# Patient Record
Sex: Female | Born: 1960 | Race: Black or African American | Hispanic: No | Marital: Single | Smoking: Former smoker
Health system: Southern US, Community
[De-identification: ages and names within clinical notes are randomized; demographics above are authoritative.]

## PROBLEM LIST (undated history)

## (undated) DIAGNOSIS — E119 Type 2 diabetes mellitus without complications: Secondary | ICD-10-CM

## (undated) DIAGNOSIS — I10 Essential (primary) hypertension: Secondary | ICD-10-CM

## (undated) DIAGNOSIS — Z8669 Personal history of other diseases of the nervous system and sense organs: Secondary | ICD-10-CM

## (undated) DIAGNOSIS — Z8619 Personal history of other infectious and parasitic diseases: Secondary | ICD-10-CM

## (undated) DIAGNOSIS — Z789 Other specified health status: Secondary | ICD-10-CM

## (undated) DIAGNOSIS — R519 Headache, unspecified: Secondary | ICD-10-CM

## (undated) DIAGNOSIS — J45909 Unspecified asthma, uncomplicated: Secondary | ICD-10-CM

## (undated) DIAGNOSIS — I82409 Acute embolism and thrombosis of unspecified deep veins of unspecified lower extremity: Secondary | ICD-10-CM

## (undated) DIAGNOSIS — E785 Hyperlipidemia, unspecified: Secondary | ICD-10-CM

## (undated) HISTORY — PX: FRACTURE SURGERY: SHX138

## (undated) HISTORY — PX: ORIF WRIST FRACTURE: SHX2133

---

## 2003-04-07 DIAGNOSIS — I82409 Acute embolism and thrombosis of unspecified deep veins of unspecified lower extremity: Secondary | ICD-10-CM

## 2003-04-07 HISTORY — PX: ABDOMINAL HYSTERECTOMY: SHX81

## 2003-04-07 HISTORY — DX: Acute embolism and thrombosis of unspecified deep veins of unspecified lower extremity: I82.409

## 2004-02-19 ENCOUNTER — Emergency Department: Payer: Self-pay | Admitting: General Practice

## 2004-02-19 ENCOUNTER — Other Ambulatory Visit: Payer: Self-pay

## 2005-03-25 ENCOUNTER — Emergency Department: Payer: Self-pay | Admitting: Emergency Medicine

## 2005-06-25 ENCOUNTER — Emergency Department: Payer: Self-pay | Admitting: Emergency Medicine

## 2005-11-28 ENCOUNTER — Inpatient Hospital Stay: Payer: Self-pay

## 2005-11-28 ENCOUNTER — Other Ambulatory Visit: Payer: Self-pay

## 2005-12-02 ENCOUNTER — Inpatient Hospital Stay: Payer: Self-pay | Admitting: Internal Medicine

## 2005-12-14 ENCOUNTER — Inpatient Hospital Stay: Payer: Self-pay | Admitting: Obstetrics and Gynecology

## 2005-12-19 ENCOUNTER — Inpatient Hospital Stay: Payer: Self-pay | Admitting: Internal Medicine

## 2006-01-21 ENCOUNTER — Emergency Department: Payer: Self-pay | Admitting: Emergency Medicine

## 2006-04-10 ENCOUNTER — Emergency Department: Payer: Self-pay | Admitting: Emergency Medicine

## 2006-04-27 ENCOUNTER — Ambulatory Visit: Payer: Self-pay | Admitting: Internal Medicine

## 2006-05-07 ENCOUNTER — Ambulatory Visit: Payer: Self-pay | Admitting: Internal Medicine

## 2006-06-03 ENCOUNTER — Ambulatory Visit: Payer: Self-pay | Admitting: Family Medicine

## 2006-06-05 ENCOUNTER — Ambulatory Visit: Payer: Self-pay | Admitting: Internal Medicine

## 2006-06-28 ENCOUNTER — Ambulatory Visit: Payer: Self-pay | Admitting: Vascular Surgery

## 2006-07-06 ENCOUNTER — Ambulatory Visit: Payer: Self-pay | Admitting: Internal Medicine

## 2006-07-21 ENCOUNTER — Emergency Department: Payer: Self-pay | Admitting: Emergency Medicine

## 2006-08-05 ENCOUNTER — Ambulatory Visit: Payer: Self-pay | Admitting: Internal Medicine

## 2006-10-05 ENCOUNTER — Ambulatory Visit: Payer: Self-pay | Admitting: Internal Medicine

## 2006-10-12 ENCOUNTER — Ambulatory Visit: Payer: Self-pay | Admitting: Internal Medicine

## 2006-10-25 ENCOUNTER — Ambulatory Visit: Payer: Self-pay | Admitting: Family Medicine

## 2006-11-05 ENCOUNTER — Ambulatory Visit: Payer: Self-pay | Admitting: Internal Medicine

## 2006-12-05 ENCOUNTER — Emergency Department: Payer: Self-pay | Admitting: Emergency Medicine

## 2007-04-07 ENCOUNTER — Ambulatory Visit: Payer: Self-pay | Admitting: Internal Medicine

## 2007-04-13 IMAGING — CR DG ANKLE COMPLETE 3+V*L*
1 series · 5 of 5 positions shown · non-contrast
Comparison: none

REASON FOR EXAM: left ankle pain
COMMENTS:

[Series 1: view not recorded · 0.17mm/px · 5 of 5 slices shown]
[im 1/5]
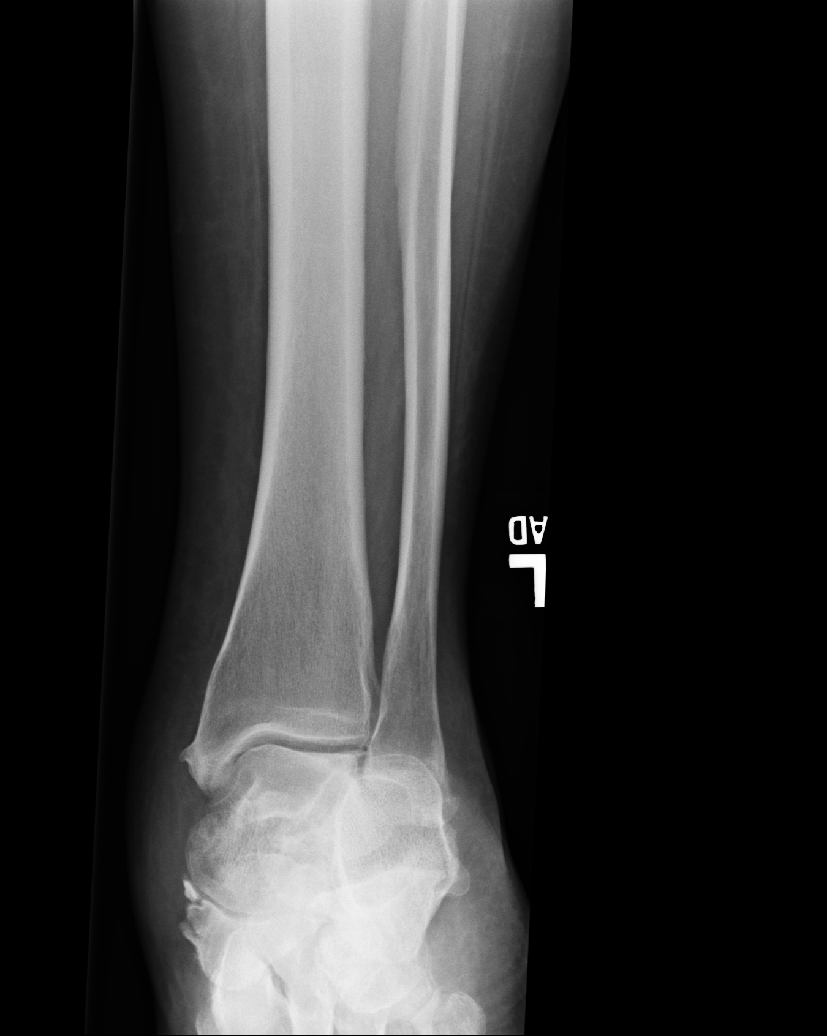
[im 2/5]
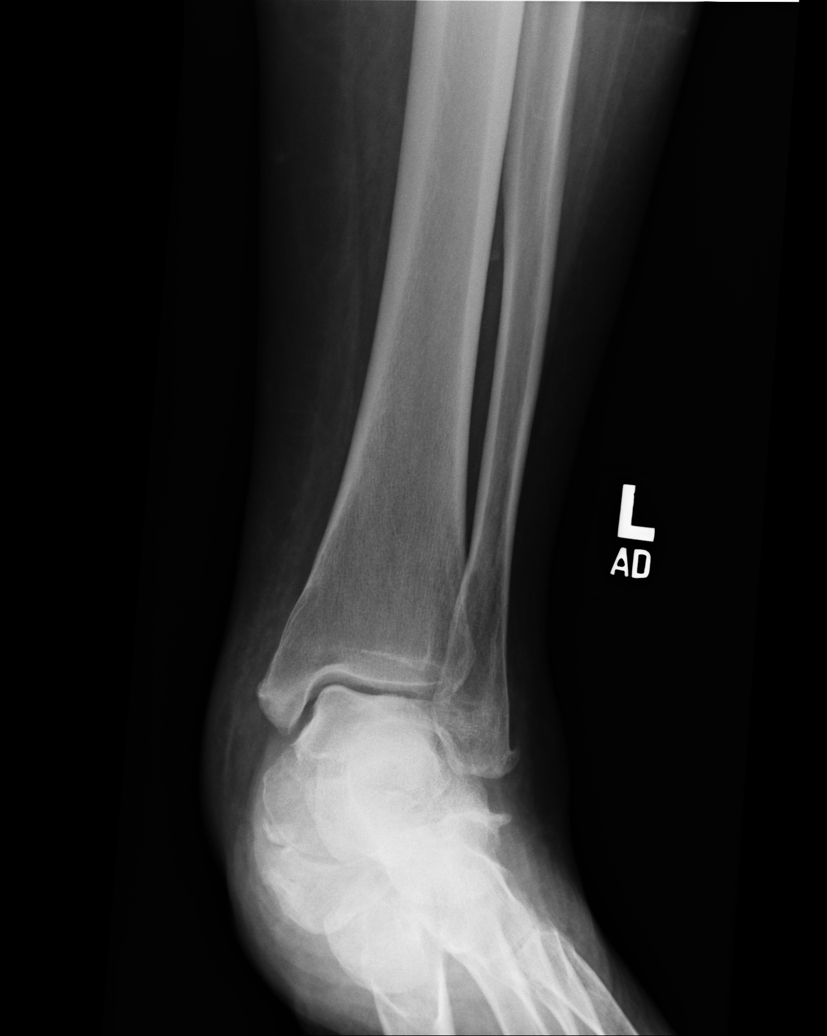
[im 3/5]
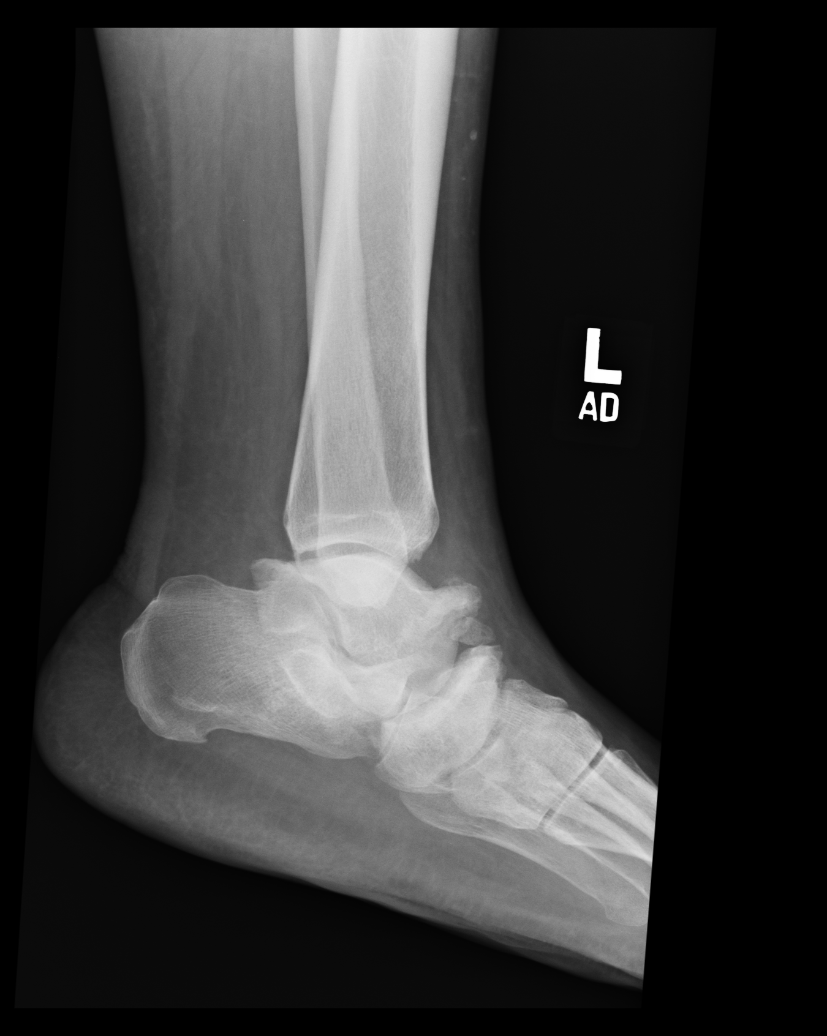
[im 4/5]
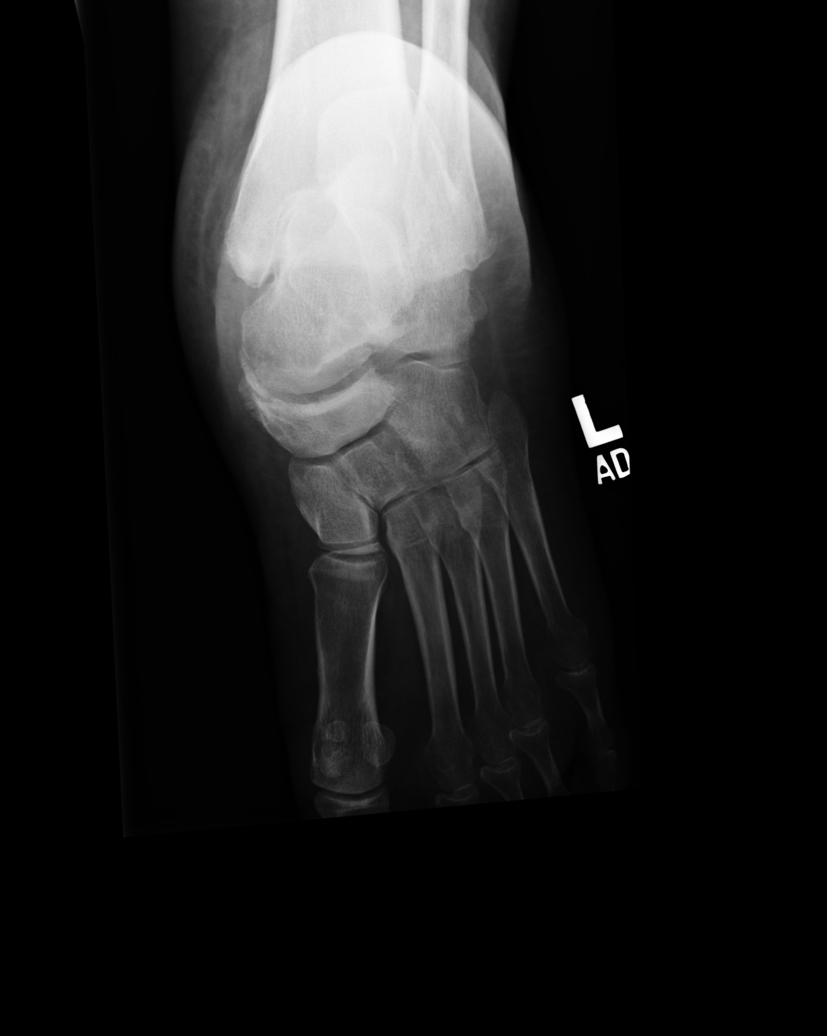
[im 5/5]
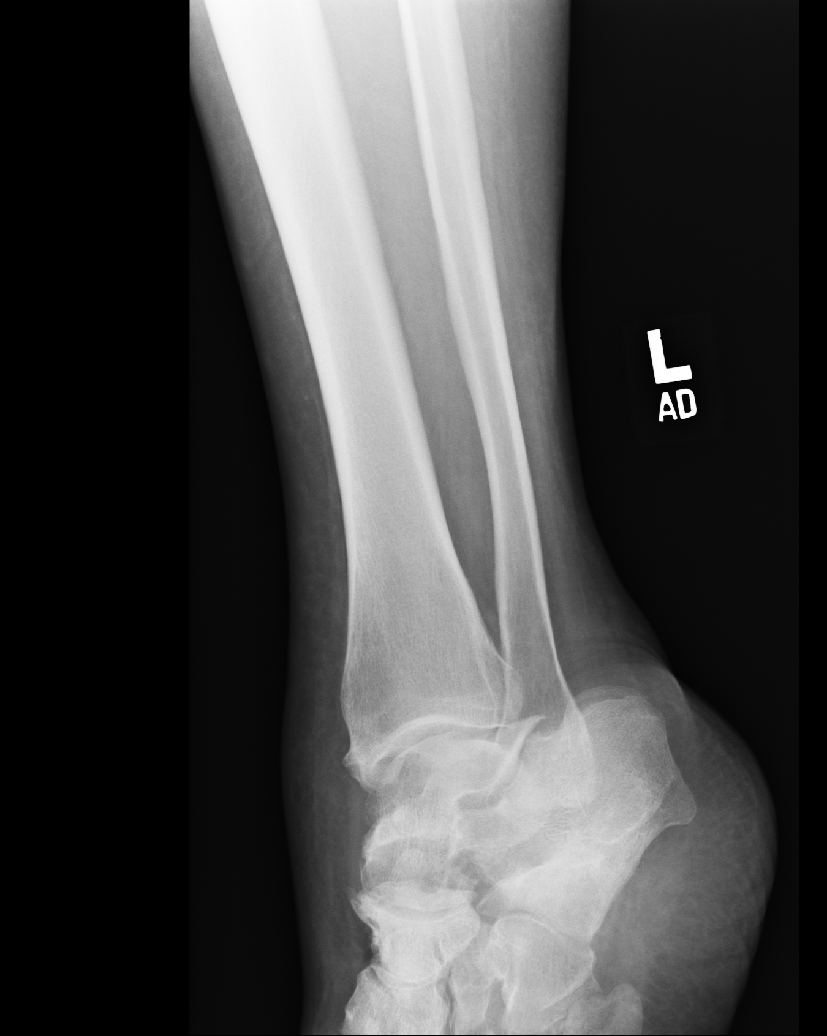

[5 of 5 positions shown; findings below may reference images not displayed]

PROCEDURE:     DXR - DXR ANKLE LEFT COMPLETE  - December 19, 2005 [DATE]

RESULT:          The patient is complaining of LEFT ankle discomfort.

There is soft tissue swelling along the medial and lateral malleoli.  The
ankle joint mortise is well maintained.  The talar dome appears intact.
There is a small spur off the inferolateral margin of the fibula.  A similar
spur is noted off the medial malleolus.
IMPRESSION: I do not see evidence of an acute fracture.  There is a
pes planus type contour of the foot.  If there are clinical findings that
might indicate abnormalities of the calcaneus or talus, further evaluation
with CT scanning may be indicated.  There is evidence of some degenerative
change involving the talonavicular joint with some bony fragmentation.  Is
there a history of trauma?  The patient has undergone prior ankle CT
scanning.  I do not have this report.

## 2007-04-14 ENCOUNTER — Ambulatory Visit: Payer: Self-pay | Admitting: Internal Medicine

## 2007-05-08 ENCOUNTER — Ambulatory Visit: Payer: Self-pay | Admitting: Internal Medicine

## 2007-07-11 ENCOUNTER — Ambulatory Visit: Payer: Self-pay | Admitting: Family Medicine

## 2007-10-05 ENCOUNTER — Ambulatory Visit: Payer: Self-pay | Admitting: Internal Medicine

## 2007-10-13 ENCOUNTER — Ambulatory Visit: Payer: Self-pay | Admitting: Internal Medicine

## 2007-10-15 IMAGING — US US EXTREM LOW VENOUS*L*
1 series · 17 of 24 positions shown · non-contrast
Comparison: none

REASON FOR EXAM: DVT Follow Up Call Report [id number]
COMMENTS:

[Series 1: us extrem low venous*left* · 17 of 28 slices shown]
[im 1/28]
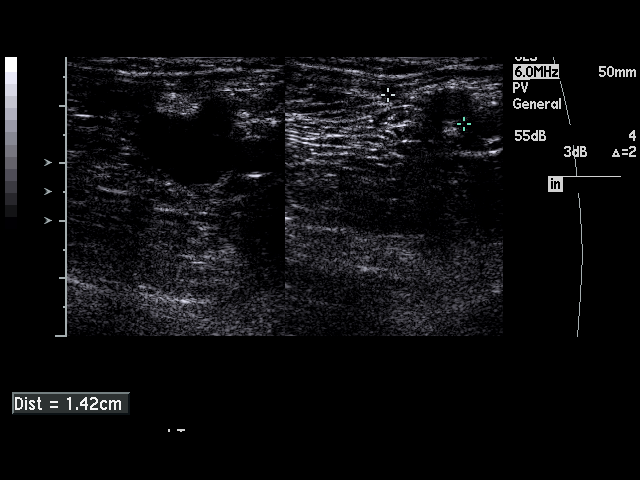
[im 3/28]
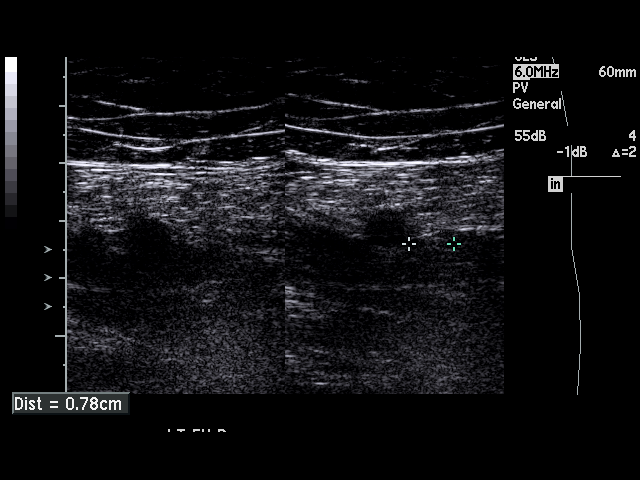
[im 4/28]
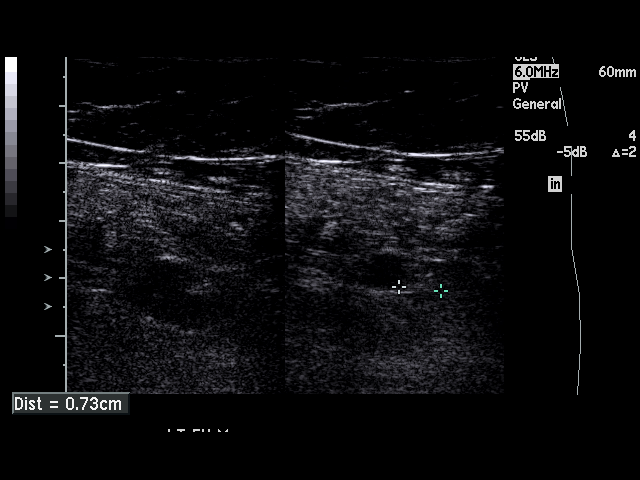
[im 5/28]
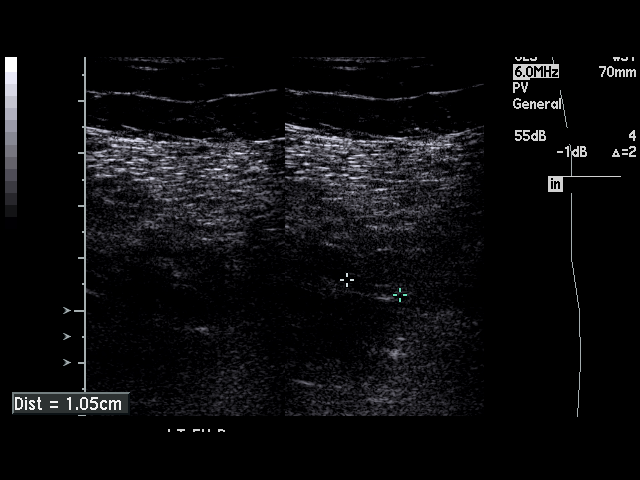
[im 8/28]
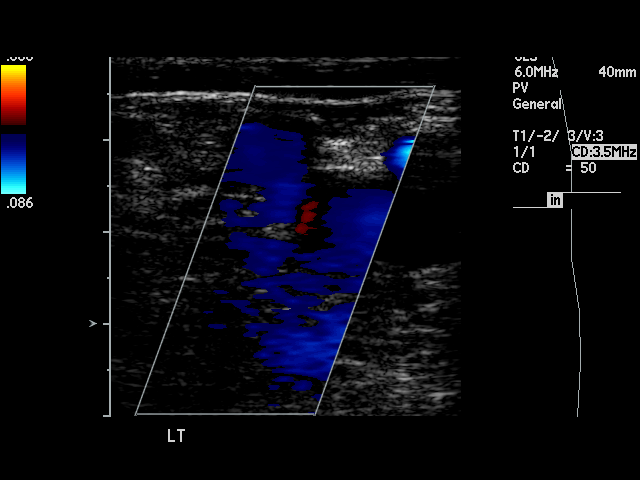
[im 9/28]
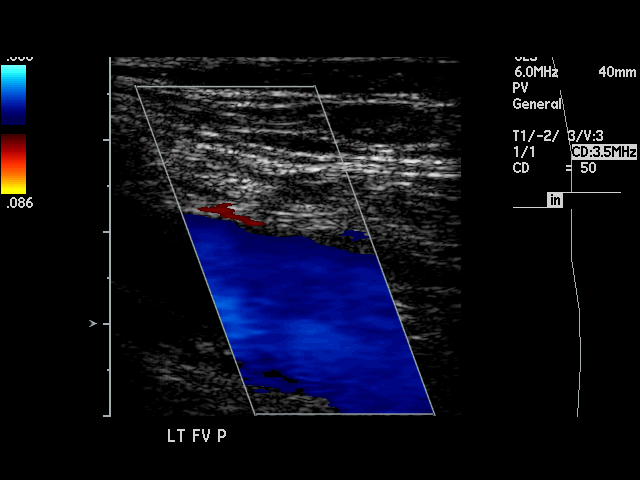
[im 11/28]
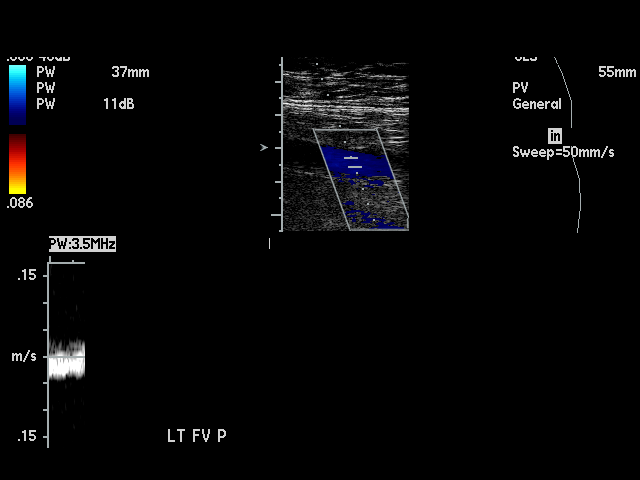
[im 12/28]
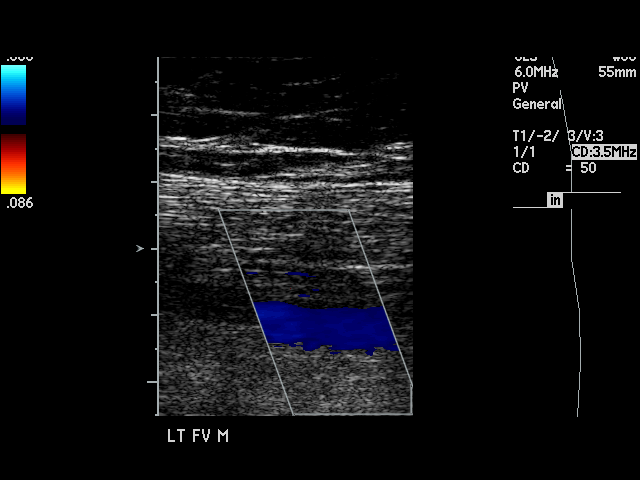
[im 15/28]
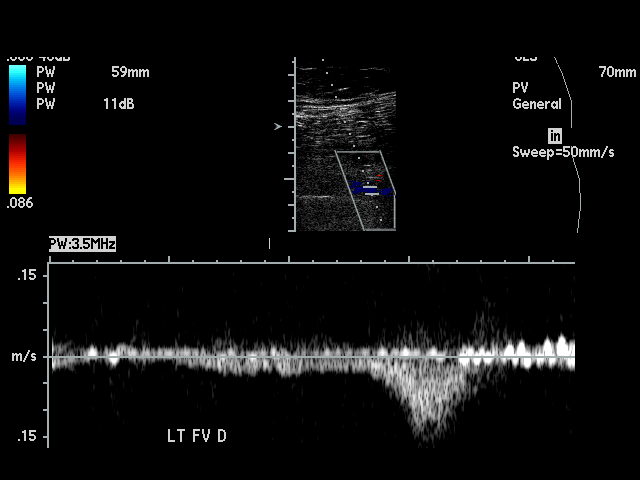
[im 16/28]
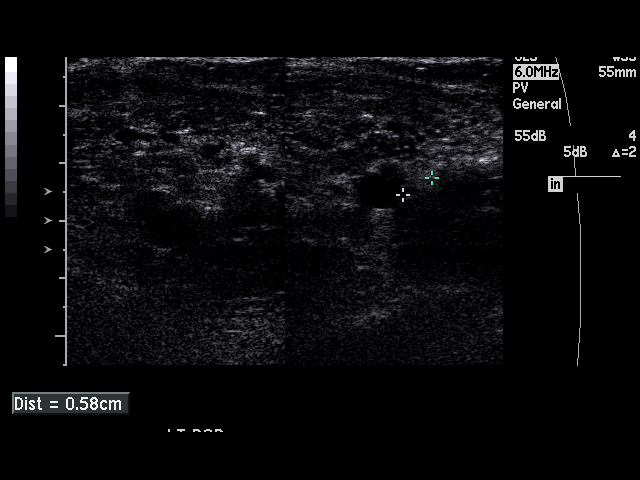
[im 17/28]
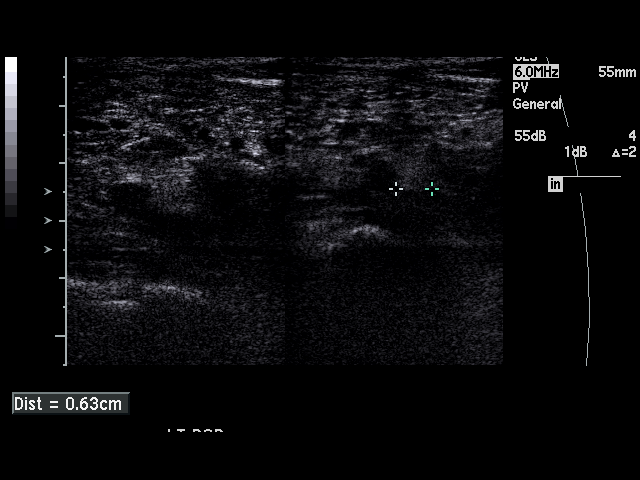
[im 19/28]
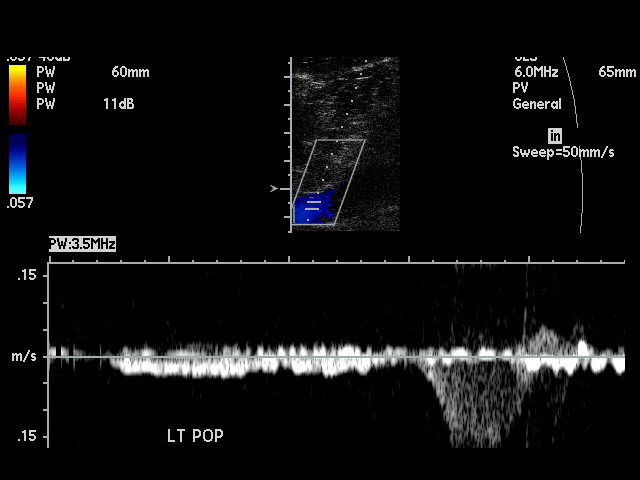
[im 20/28]
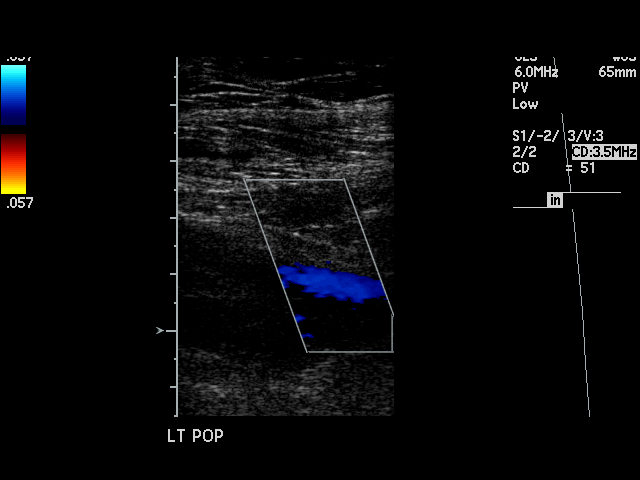
[im 23/28]
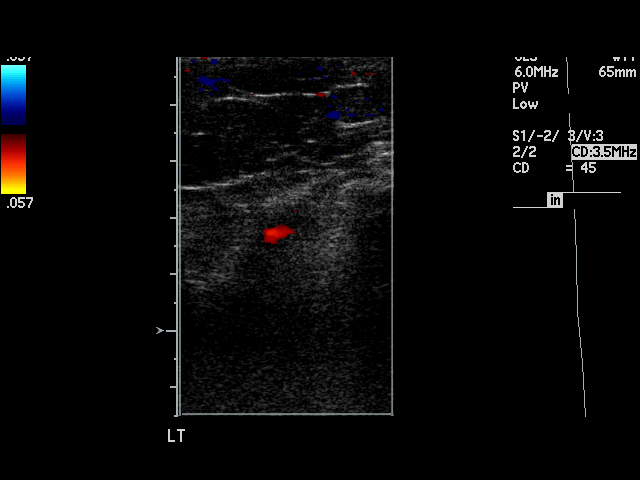
[im 24/28]
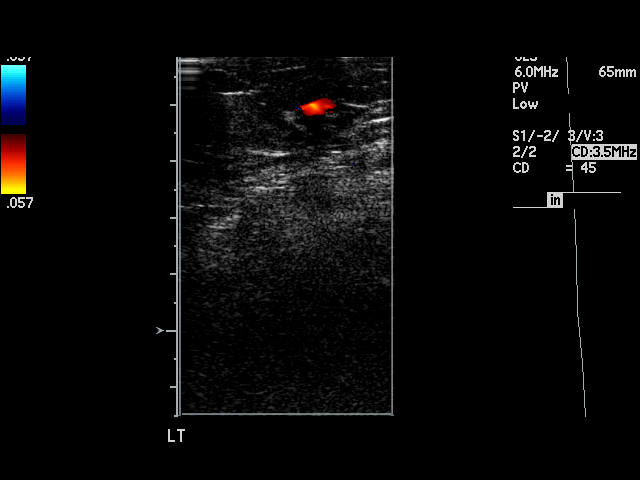
[im 25/28]
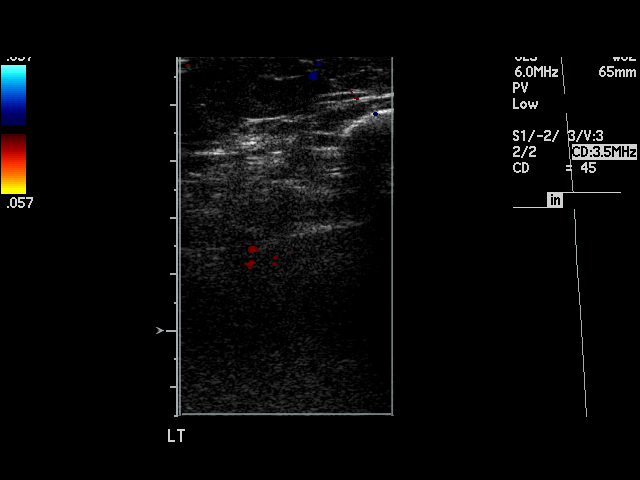
[im 28/28]
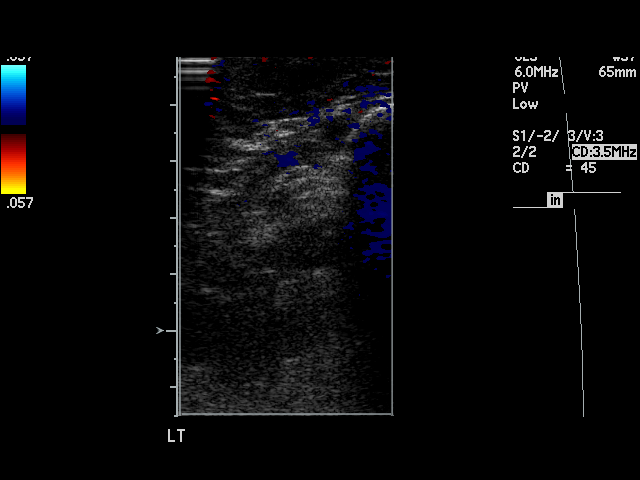

[17 of 24 positions shown; findings below may reference images not displayed]

PROCEDURE:     US  - US DOPPLER LOW EXTR LEFT  - June 22, 2006  [DATE]

RESULT:     The LEFT femoral and popliteal vein shows normal
compressibility. The phasic and augmentation Valsalva flow waveforms are
normal. Doppler examination shows no deep venous thrombosis. The
non-occlusive thrombus in the LEFT popliteal noted on the prior exam of
04/10/2006 is no longer seen
IMPRESSION: 1.     Normal study. No deep venous thrombosis is identified.

## 2007-11-01 ENCOUNTER — Ambulatory Visit: Payer: Self-pay | Admitting: Family Medicine

## 2007-11-05 ENCOUNTER — Ambulatory Visit: Payer: Self-pay | Admitting: Internal Medicine

## 2008-01-19 ENCOUNTER — Ambulatory Visit: Payer: Self-pay | Admitting: Family Medicine

## 2008-02-19 ENCOUNTER — Emergency Department: Payer: Self-pay | Admitting: Emergency Medicine

## 2008-03-07 ENCOUNTER — Ambulatory Visit: Payer: Self-pay | Admitting: Podiatry

## 2008-03-13 ENCOUNTER — Ambulatory Visit: Payer: Self-pay | Admitting: Podiatry

## 2008-03-16 ENCOUNTER — Ambulatory Visit: Payer: Self-pay | Admitting: Podiatry

## 2008-04-06 HISTORY — PX: ORIF FOOT FRACTURE: SHX2123

## 2008-10-04 ENCOUNTER — Ambulatory Visit: Payer: Self-pay | Admitting: Internal Medicine

## 2008-11-01 ENCOUNTER — Ambulatory Visit: Payer: Self-pay | Admitting: Podiatry

## 2008-11-04 ENCOUNTER — Ambulatory Visit: Payer: Self-pay | Admitting: Internal Medicine

## 2008-11-09 ENCOUNTER — Ambulatory Visit: Payer: Self-pay | Admitting: Podiatry

## 2009-03-20 ENCOUNTER — Ambulatory Visit: Payer: Self-pay

## 2009-07-10 ENCOUNTER — Emergency Department: Payer: Self-pay | Admitting: Internal Medicine

## 2010-04-17 ENCOUNTER — Ambulatory Visit: Payer: Self-pay | Admitting: Specialist

## 2010-04-24 ENCOUNTER — Ambulatory Visit: Payer: Self-pay | Admitting: Specialist

## 2010-05-14 ENCOUNTER — Ambulatory Visit: Payer: Self-pay | Admitting: Family Medicine

## 2010-07-26 ENCOUNTER — Emergency Department: Payer: Self-pay | Admitting: Emergency Medicine

## 2011-12-25 ENCOUNTER — Emergency Department: Payer: Self-pay | Admitting: Emergency Medicine

## 2013-03-31 ENCOUNTER — Emergency Department: Payer: Self-pay | Admitting: Emergency Medicine

## 2013-04-06 HISTORY — PX: FOOT FUSION: SHX956

## 2013-10-24 ENCOUNTER — Other Ambulatory Visit: Payer: Self-pay | Admitting: Orthopedic Surgery

## 2013-10-24 DIAGNOSIS — M25571 Pain in right ankle and joints of right foot: Secondary | ICD-10-CM

## 2013-12-14 ENCOUNTER — Ambulatory Visit
Admission: RE | Admit: 2013-12-14 | Discharge: 2013-12-14 | Disposition: A | Discharge status: Home / Self Care | Payer: BC Managed Care – PPO | Source: Ambulatory Visit | Attending: Orthopedic Surgery | Admitting: Orthopedic Surgery

## 2013-12-14 ENCOUNTER — Other Ambulatory Visit: Payer: Self-pay

## 2013-12-14 DIAGNOSIS — M25571 Pain in right ankle and joints of right foot: Secondary | ICD-10-CM

## 2014-06-26 ENCOUNTER — Encounter (HOSPITAL_BASED_OUTPATIENT_CLINIC_OR_DEPARTMENT_OTHER): Payer: Self-pay | Admitting: *Deleted

## 2014-06-26 NOTE — Progress Notes (Signed)
No pcp-no meds, denies any cardiac or resp problems, denies sleep apnea

## 2014-06-26 NOTE — Progress Notes (Signed)
Pt lives near [place] not need to come in for any labs-bmi over 46-she has had several ortho surgeries, no post op breathing problems

## 2014-07-05 ENCOUNTER — Encounter (HOSPITAL_BASED_OUTPATIENT_CLINIC_OR_DEPARTMENT_OTHER): Payer: Self-pay

## 2014-07-05 ENCOUNTER — Ambulatory Visit (HOSPITAL_BASED_OUTPATIENT_CLINIC_OR_DEPARTMENT_OTHER)
Admission: RE | Admit: 2014-07-05 | Discharge: 2014-07-05 | Disposition: A | Discharge status: Home / Self Care | Payer: BLUE CROSS/BLUE SHIELD | Source: Ambulatory Visit | Attending: Orthopedic Surgery | Admitting: Orthopedic Surgery

## 2014-07-05 ENCOUNTER — Encounter (HOSPITAL_BASED_OUTPATIENT_CLINIC_OR_DEPARTMENT_OTHER)
Admission: RE | Disposition: A | Discharge status: Home / Self Care | Payer: Self-pay | Source: Ambulatory Visit | Attending: Orthopedic Surgery

## 2014-07-05 ENCOUNTER — Ambulatory Visit (HOSPITAL_BASED_OUTPATIENT_CLINIC_OR_DEPARTMENT_OTHER): Payer: BLUE CROSS/BLUE SHIELD | Admitting: Certified Registered"

## 2014-07-05 DIAGNOSIS — Z6841 Body Mass Index (BMI) 40.0 and over, adult: Secondary | ICD-10-CM | POA: Diagnosis not present

## 2014-07-05 DIAGNOSIS — Z86718 Personal history of other venous thrombosis and embolism: Secondary | ICD-10-CM | POA: Insufficient documentation

## 2014-07-05 DIAGNOSIS — T8484XA Pain due to internal orthopedic prosthetic devices, implants and grafts, initial encounter: Secondary | ICD-10-CM | POA: Diagnosis not present

## 2014-07-05 DIAGNOSIS — Z791 Long term (current) use of non-steroidal anti-inflammatories (NSAID): Secondary | ICD-10-CM | POA: Insufficient documentation

## 2014-07-05 DIAGNOSIS — Y838 Other surgical procedures as the cause of abnormal reaction of the patient, or of later complication, without mention of misadventure at the time of the procedure: Secondary | ICD-10-CM | POA: Insufficient documentation

## 2014-07-05 DIAGNOSIS — T8484XD Pain due to internal orthopedic prosthetic devices, implants and grafts, subsequent encounter: Secondary | ICD-10-CM

## 2014-07-05 DIAGNOSIS — Z87891 Personal history of nicotine dependence: Secondary | ICD-10-CM | POA: Insufficient documentation

## 2014-07-05 DIAGNOSIS — Y929 Unspecified place or not applicable: Secondary | ICD-10-CM | POA: Diagnosis not present

## 2014-07-05 DIAGNOSIS — Z981 Arthrodesis status: Secondary | ICD-10-CM | POA: Diagnosis not present

## 2014-07-05 DIAGNOSIS — M25571 Pain in right ankle and joints of right foot: Secondary | ICD-10-CM | POA: Diagnosis present

## 2014-07-05 HISTORY — DX: Acute embolism and thrombosis of unspecified deep veins of unspecified lower extremity: I82.409

## 2014-07-05 HISTORY — DX: Other specified health status: Z78.9

## 2014-07-05 HISTORY — PX: HARDWARE REMOVAL: SHX979

## 2014-07-05 SURGERY — REMOVAL, HARDWARE
Anesthesia: General | Site: Ankle | Laterality: Right

## 2014-07-05 MED ORDER — FENTANYL CITRATE 0.05 MG/ML IJ SOLN
INTRAMUSCULAR | Status: AC
  Filled 2014-07-05: qty 6

## 2014-07-05 MED ORDER — MIDAZOLAM HCL 2 MG/2ML IJ SOLN
INTRAMUSCULAR | Status: AC
  Filled 2014-07-05: qty 2

## 2014-07-05 MED ORDER — HYDROMORPHONE HCL 1 MG/ML IJ SOLN
0.2500 mg | INTRAMUSCULAR | Status: DC | PRN
  Administered 2014-07-05: 0.25 mg via INTRAVENOUS
  Administered 2014-07-05: 0.5 mg via INTRAVENOUS
  Administered 2014-07-05: 0.25 mg via INTRAVENOUS
  Administered 2014-07-05: 0.5 mg via INTRAVENOUS

## 2014-07-05 MED ORDER — ONDANSETRON HCL 4 MG/2ML IJ SOLN
INTRAMUSCULAR | Status: DC | PRN
  Administered 2014-07-05: 4 mg via INTRAVENOUS

## 2014-07-05 MED ORDER — DEXTROSE 5 % IV SOLN
3.0000 g | INTRAVENOUS | Status: AC
  Administered 2014-07-05: 3 g via INTRAVENOUS

## 2014-07-05 MED ORDER — OXYCODONE HCL 5 MG PO TABS
5.0000 mg | ORAL_TABLET | Freq: Once | ORAL | Status: AC | PRN
  Administered 2014-07-05: 5 mg via ORAL

## 2014-07-05 MED ORDER — DEXAMETHASONE SODIUM PHOSPHATE 10 MG/ML IJ SOLN
INTRAMUSCULAR | Status: DC | PRN
  Administered 2014-07-05: 10 mg via INTRAVENOUS

## 2014-07-05 MED ORDER — CEFAZOLIN SODIUM-DEXTROSE 2-3 GM-% IV SOLR
INTRAVENOUS | Status: AC
  Filled 2014-07-05: qty 50

## 2014-07-05 MED ORDER — 0.9 % SODIUM CHLORIDE (POUR BTL) OPTIME
TOPICAL | Status: DC | PRN
  Administered 2014-07-05: 200 mL

## 2014-07-05 MED ORDER — LACTATED RINGERS IV SOLN
INTRAVENOUS | Status: DC
  Administered 2014-07-05 (×2): via INTRAVENOUS

## 2014-07-05 MED ORDER — FENTANYL CITRATE 0.05 MG/ML IJ SOLN: 50.0000 ug | INTRAMUSCULAR | Status: DC | PRN

## 2014-07-05 MED ORDER — HYDROMORPHONE HCL 1 MG/ML IJ SOLN
INTRAMUSCULAR | Status: AC
  Filled 2014-07-05: qty 1

## 2014-07-05 MED ORDER — LIDOCAINE HCL (CARDIAC) 20 MG/ML IV SOLN
INTRAVENOUS | Status: DC | PRN
  Administered 2014-07-05: 60 mg via INTRAVENOUS

## 2014-07-05 MED ORDER — ONDANSETRON HCL 4 MG/2ML IJ SOLN: 4.0000 mg | Freq: Four times a day (QID) | INTRAMUSCULAR | Status: DC | PRN

## 2014-07-05 MED ORDER — BACITRACIN ZINC 500 UNIT/GM EX OINT
TOPICAL_OINTMENT | CUTANEOUS | Status: AC
  Filled 2014-07-05: qty 28.35

## 2014-07-05 MED ORDER — BUPIVACAINE-EPINEPHRINE (PF) 0.5% -1:200000 IJ SOLN
INTRAMUSCULAR | Status: AC
  Filled 2014-07-05: qty 30

## 2014-07-05 MED ORDER — PROPOFOL 10 MG/ML IV BOLUS
INTRAVENOUS | Status: DC | PRN
  Administered 2014-07-05: 200 mg via INTRAVENOUS

## 2014-07-05 MED ORDER — BACITRACIN ZINC 500 UNIT/GM EX OINT
TOPICAL_OINTMENT | CUTANEOUS | Status: DC | PRN
  Administered 2014-07-05: 1 via TOPICAL

## 2014-07-05 MED ORDER — SODIUM CHLORIDE 0.9 % IV SOLN: INTRAVENOUS | Status: DC

## 2014-07-05 MED ORDER — FENTANYL CITRATE 0.05 MG/ML IJ SOLN
INTRAMUSCULAR | Status: DC | PRN
Start: 2014-07-05 — End: 2014-07-05
  Administered 2014-07-05: 50 ug via INTRAVENOUS
  Administered 2014-07-05 (×10): 25 ug via INTRAVENOUS

## 2014-07-05 MED ORDER — OXYCODONE HCL 5 MG PO TABS
ORAL_TABLET | ORAL | Status: AC
  Filled 2014-07-05: qty 1

## 2014-07-05 MED ORDER — OXYCODONE HCL 5 MG/5ML PO SOLN: 5.0000 mg | Freq: Once | ORAL | Status: AC | PRN

## 2014-07-05 MED ORDER — MIDAZOLAM HCL 5 MG/5ML IJ SOLN
INTRAMUSCULAR | Status: DC | PRN
  Administered 2014-07-05: 2 mg via INTRAVENOUS

## 2014-07-05 MED ORDER — CHLORHEXIDINE GLUCONATE 4 % EX LIQD: 60.0000 mL | Freq: Once | CUTANEOUS | Status: DC

## 2014-07-05 MED ORDER — BUPIVACAINE-EPINEPHRINE 0.5% -1:200000 IJ SOLN
INTRAMUSCULAR | Status: DC | PRN
  Administered 2014-07-05: 20 mL

## 2014-07-05 MED ORDER — MIDAZOLAM HCL 2 MG/2ML IJ SOLN: 1.0000 mg | INTRAMUSCULAR | Status: DC | PRN

## 2014-07-05 MED ORDER — PROPOFOL 10 MG/ML IV EMUL
INTRAVENOUS | Status: AC
  Filled 2014-07-05: qty 50

## 2014-07-05 MED ORDER — HYDROCODONE-ACETAMINOPHEN 5-325 MG PO TABS: 1.0000 | ORAL_TABLET | Freq: Four times a day (QID) | ORAL | Status: DC | PRN

## 2014-07-05 SURGICAL SUPPLY — 65 items
BAG DECANTER FOR FLEXI CONT (MISCELLANEOUS) IMPLANT
BANDAGE ELASTIC 4 VELCRO ST LF (GAUZE/BANDAGES/DRESSINGS) IMPLANT
BANDAGE ESMARK 6X9 LF (GAUZE/BANDAGES/DRESSINGS) ×1 IMPLANT
BLADE SURG 15 STRL LF DISP TIS (BLADE) ×2 IMPLANT
BLADE SURG 15 STRL SS (BLADE) ×2
BNDG COHESIVE 4X5 TAN STRL (GAUZE/BANDAGES/DRESSINGS) ×2 IMPLANT
BNDG COHESIVE 6X5 TAN STRL LF (GAUZE/BANDAGES/DRESSINGS) IMPLANT
BNDG ESMARK 4X9 LF (GAUZE/BANDAGES/DRESSINGS) IMPLANT
BNDG ESMARK 6X9 LF (GAUZE/BANDAGES/DRESSINGS) ×2
CHLORAPREP W/TINT 26ML (MISCELLANEOUS) ×2 IMPLANT
COVER BACK TABLE 60X90IN (DRAPES) ×2 IMPLANT
CUFF TOURNIQUET SINGLE 34IN LL (TOURNIQUET CUFF) IMPLANT
DECANTER SPIKE VIAL GLASS SM (MISCELLANEOUS) IMPLANT
DRAPE EXTREMITY T 121X128X90 (DRAPE) ×2 IMPLANT
DRAPE OEC MINIVIEW 54X84 (DRAPES) ×2 IMPLANT
DRAPE SURG 17X23 STRL (DRAPES) IMPLANT
DRAPE U-SHAPE 47X51 STRL (DRAPES) ×2 IMPLANT
DRSG EMULSION OIL 3X3 NADH (GAUZE/BANDAGES/DRESSINGS) ×2 IMPLANT
DRSG PAD ABDOMINAL 8X10 ST (GAUZE/BANDAGES/DRESSINGS) IMPLANT
ELECT REM PT RETURN 9FT ADLT (ELECTROSURGICAL) ×2
ELECTRODE REM PT RTRN 9FT ADLT (ELECTROSURGICAL) ×1 IMPLANT
GAUZE SPONGE 4X4 12PLY STRL (GAUZE/BANDAGES/DRESSINGS) ×2 IMPLANT
GLOVE BIO SURGEON STRL SZ7 (GLOVE) ×2 IMPLANT
GLOVE BIO SURGEON STRL SZ8 (GLOVE) ×2 IMPLANT
GLOVE BIOGEL PI IND STRL 7.0 (GLOVE) ×2 IMPLANT
GLOVE BIOGEL PI IND STRL 8 (GLOVE) ×1 IMPLANT
GLOVE BIOGEL PI INDICATOR 7.0 (GLOVE) ×2
GLOVE BIOGEL PI INDICATOR 8 (GLOVE) ×1
GLOVE ECLIPSE 6.5 STRL STRAW (GLOVE) ×2 IMPLANT
GLOVE EXAM NITRILE MD LF STRL (GLOVE) ×2 IMPLANT
GOWN STRL REUS W/ TWL LRG LVL3 (GOWN DISPOSABLE) ×2 IMPLANT
GOWN STRL REUS W/ TWL XL LVL3 (GOWN DISPOSABLE) ×1 IMPLANT
GOWN STRL REUS W/TWL LRG LVL3 (GOWN DISPOSABLE) ×2
GOWN STRL REUS W/TWL XL LVL3 (GOWN DISPOSABLE) ×1
K-WIRE ACE 1.6X6 (WIRE) ×2
KWIRE ACE 1.6X6 (WIRE) ×1 IMPLANT
NEEDLE HYPO 22GX1.5 SAFETY (NEEDLE) ×2 IMPLANT
PACK BASIN DAY SURGERY FS (CUSTOM PROCEDURE TRAY) ×2 IMPLANT
PAD CAST 4YDX4 CTTN HI CHSV (CAST SUPPLIES) ×1 IMPLANT
PADDING CAST ABS 4INX4YD NS (CAST SUPPLIES)
PADDING CAST ABS COTTON 4X4 ST (CAST SUPPLIES) IMPLANT
PADDING CAST COTTON 4X4 STRL (CAST SUPPLIES) ×1
PADDING CAST COTTON 6X4 STRL (CAST SUPPLIES) IMPLANT
PENCIL BUTTON HOLSTER BLD 10FT (ELECTRODE) ×2 IMPLANT
PIN THREADED GUIDE ACE (PIN) ×4 IMPLANT
SANITIZER HAND PURELL 535ML FO (MISCELLANEOUS) ×2 IMPLANT
SHEET MEDIUM DRAPE 40X70 STRL (DRAPES) ×2 IMPLANT
SLEEVE SCD COMPRESS KNEE MED (MISCELLANEOUS) ×2 IMPLANT
SPLINT FAST PLASTER 5X30 (CAST SUPPLIES)
SPLINT PLASTER CAST FAST 5X30 (CAST SUPPLIES) IMPLANT
SPONGE LAP 18X18 X RAY DECT (DISPOSABLE) ×2 IMPLANT
STOCKINETTE 6  STRL (DRAPES) ×1
STOCKINETTE 6 STRL (DRAPES) ×1 IMPLANT
SUCTION FRAZIER TIP 10 FR DISP (SUCTIONS) ×2 IMPLANT
SUT ETHILON 3 0 PS 1 (SUTURE) ×2 IMPLANT
SUT MNCRL AB 3-0 PS2 18 (SUTURE) ×4 IMPLANT
SUT VIC AB 0 SH 27 (SUTURE) IMPLANT
SUT VIC AB 2-0 SH 27 (SUTURE)
SUT VIC AB 2-0 SH 27XBRD (SUTURE) IMPLANT
SUT VICRYL 4-0 PS2 18IN ABS (SUTURE) IMPLANT
SYR BULB 3OZ (MISCELLANEOUS) ×2 IMPLANT
SYR CONTROL 10ML LL (SYRINGE) ×2 IMPLANT
TOWEL OR 17X24 6PK STRL BLUE (TOWEL DISPOSABLE) ×4 IMPLANT
TUBE CONNECTING 20X1/4 (TUBING) ×2 IMPLANT
UNDERPAD 30X30 INCONTINENT (UNDERPADS AND DIAPERS) ×2 IMPLANT

## 2014-07-05 NOTE — Anesthesia Postprocedure Evaluation (Signed)
Anesthesia Post Note  Patient: Alexandra Macias  Procedure(s) Performed: Procedure(s) (LRB): REMOVAL OF DEEP IMPLANTS OF RIGHT ANKLE (Right)  Anesthesia type: General  Patient location: PACU  Post pain: Pain level controlled and Adequate analgesia  Post assessment: Post-op Vital signs reviewed, Patient's Cardiovascular Status Stable, Respiratory Function Stable, Patent Airway and Pain level controlled  Last Vitals:  Filed Vitals:   07/05/14 1000  BP: 144/68  Pulse: 70  Temp:   Resp: 15    Post vital signs: Reviewed and stable  Level of consciousness: awake, alert  and oriented  Complications: No apparent anesthesia complications

## 2014-07-05 NOTE — Anesthesia Preprocedure Evaluation (Addendum)
Anesthesia Evaluation  Patient identified by MRN, date of birth, ID band Patient awake    Reviewed: Allergy & Precautions, NPO status , Patient's Chart, lab work & pertinent test results  Airway Mallampati: II   Neck ROM: full    Dental   Pulmonary former smoker,  breath sounds clear to auscultation        Cardiovascular negative cardio ROS  Rhythm:regular Rate:Normal     Neuro/Psych    GI/Hepatic   Endo/Other  Morbid obesity  Renal/GU      Musculoskeletal   Abdominal   Peds  Hematology   Anesthesia Other Findings   Reproductive/Obstetrics                           Anesthesia Physical Anesthesia Plan  ASA: II  Anesthesia Plan: General   Post-op Pain Management:    Induction: Intravenous  Airway Management Planned: LMA  Additional Equipment:   Intra-op Plan:   Post-operative Plan:   Informed Consent: I have reviewed the patients History and Physical, chart, labs and discussed the procedure including the risks, benefits and alternatives for the proposed anesthesia with the patient or authorized representative who has indicated his/her understanding and acceptance.     Plan Discussed with: CRNA, Anesthesiologist and Surgeon  Anesthesia Plan Comments:        Anesthesia Quick Evaluation

## 2014-07-05 NOTE — Discharge Instructions (Signed)
Alexandra Simmer, MD [place]  Please read the following information regarding your care after surgery.  Medications  You only need a prescription for the narcotic pain medicine (ex. oxycodone, Percocet, Norco).  All of the other medicines listed below are available over the counter. X norco as prescribed for severe pain X ibuprofen as needed for moderate pain  Narcotic pain medicine (ex. oxycodone, Percocet, Vicodin) will cause constipation.  To prevent this problem, take the following medicines while you are taking any pain medicine. X docusate sodium (Colace) 100 mg twice a day X senna (Senokot) 2 tablets twice a day  Weight Bearing X Bear weight when you are able on your operated leg or foot in the post-op shoe. ? Bear weight only on the heel of your operated foot  ? Do not bear any weight on the operated leg or foot.  Cast / Splint / Dressing X Keep your dressing clean and dry.  Dont put anything (coat hanger, pencil, etc) down inside of it.  If it gets damp, use a hair dryer on the cool setting to dry it.  If it gets soaked, call the office to schedule an appointment for a cast change.  After your dressing, cast or splint is removed; you may shower, but do not soak or scrub the wound.  Allow the water to run over it, and then gently pat it dry.  Swelling It is normal for you to have swelling where you had surgery.  To reduce swelling and pain, keep your toes above your nose for at least 3 days after surgery.  It may be necessary to keep your foot or leg elevated for several weeks.  If it hurts, it should be elevated.  Follow Up Call my office at [phone] when you are discharged from the hospital or surgery center to schedule an appointment to be seen two weeks after surgery.  Call my office at [phone] if you develop a fever >101.5 F, nausea, vomiting, bleeding from the surgical site or severe pain.     Post Anesthesia Home Care Instructions  Activity: Get  plenty of rest for the remainder of the day. A responsible adult should stay with you for 24 hours following the procedure.  For the next 24 hours, DO NOT: -Drive a car -Paediatric nurse -Drink alcoholic beverages -Take any medication unless instructed by your physician -Make any legal decisions or sign important papers.  Meals: Start with liquid foods such as gelatin or soup. Progress to regular foods as tolerated. Avoid greasy, spicy, heavy foods. If nausea and/or vomiting occur, drink only clear liquids until the nausea and/or vomiting subsides. Call your physician if vomiting continues.  Special Instructions/Symptoms: Your throat may feel dry or sore from the anesthesia or the breathing tube placed in your throat during surgery. If this causes discomfort, gargle with warm salt water. The discomfort should disappear within 24 hours.  If you had a scopolamine patch placed behind your ear for the management of post- operative nausea and/or vomiting:  1. The medication in the patch is effective for 72 hours, after which it should be removed.  Wrap patch in a tissue and discard in the trash. Wash hands thoroughly with soap and water. 2. You may remove the patch earlier than 72 hours if you experience unpleasant side effects which may include dry mouth, dizziness or visual disturbances. 3. Avoid touching the patch. Wash your hands with soap and water after contact with the patch.

## 2014-07-05 NOTE — Brief Op Note (Signed)
07/05/2014  9:16 AM  PATIENT:  Alexandra Macias  54 y.o. 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  Removal of deep implants from the right talus 2.  Removal of deep implants from the right navicular (through a separate medial incision) 3.  Removal of deep implants from the right calcaneus (through a separate posterior incision) 4.  3 view xrays of the right foot  SURGEON:  Wylene Simmer, MD  ASSISTANT: n/a  ANESTHESIA:   General  EBL:  minimal   TOURNIQUET:   Total Tourniquet Time Documented: Leg (Right) - 73 minutes Total: Leg (Right) - 73 minutes  COMPLICATIONS:  None apparent  DISPOSITION:  Extubated, awake and stable to recovery.  DICTATION ID:  [id number]

## 2014-07-05 NOTE — H&P (Signed)
Alexandra Macias is an 54 y.o. female.   Chief Complaint: right ankle pain HPI: 54 y/o female with PMH of subtalar arthrodesis c/o pain where her hardware irritates the soft tissues at her heel and anterior ankle.  She also has a nonunion of the subtalar joint but declines revision surgery at this time.  She presents for removal of deep implants in an effort to improve her pain.  Past Medical History  Diagnosis Date  . Medical history non-contributory   . DVT (deep venous thrombosis) 29-Jul-2003    post op hysterectomy    Past Surgical History  Procedure Laterality Date  . Foot fusion  28-Jul-2013    right-fx  . Orif wrist fracture      both rt and lt  . Orif foot fracture  07/28/2008    left  . Abdominal hysterectomy  2003/07/29    History reviewed. No pertinent family history. Social History:  reports that she quit smoking about 6 months ago. She does not have any smokeless tobacco history on file. She reports that she drinks alcohol. She reports that she does not use illicit drugs.  Allergies: No Known Allergies  Medications Prior to Admission  Medication Sig Dispense Refill  . ibuprofen (ADVIL,MOTRIN) 200 MG tablet Take 200 mg by mouth every 6 (six) hours as needed.      No results found for this or any previous visit (from the past 48 hour(s)). No results found.  ROS  No recent f/c/n/v/wt loss  Blood pressure 151/53, pulse 55, temperature 98.4 F (36.9 C), temperature source Oral, resp. rate 21, height 5\' 7"  (1.702 m), weight 147.873 kg (326 lb), SpO2 100 %. Physical Exam  Obese female in nad.  A and O x 4.  Mood and affect normal.  EOMI.  Res punlabored.  Poor dentition.  R ankle with healed surgical incisions.  No lymphadenopathy.  Palpable pulses.  Normal sens to LT dorsally and plantarly.  Assessment/Plan Painful hardware right ankle and foot - to OR for removal of deep implants from the right talus, calcaneus and navicular.  The risks and benefits of the alternative treatment options have  been discussed in detail.  The patient wishes to proceed with surgery and specifically understands risks of bleeding, infection, nerve damage, blood clots, need for additional surgery, amputation and death.   Wylene Simmer 07/29/14, 7:20 AM

## 2014-07-05 NOTE — Transfer of Care (Signed)
Immediate Anesthesia Transfer of Care Note  Patient: Alexandra Macias  Procedure(s) Performed: Procedure(s): REMOVAL OF DEEP IMPLANTS OF RIGHT ANKLE (Right)  Patient Location: PACU  Anesthesia Type:General  Level of Consciousness: awake, alert , oriented and patient cooperative  Airway & Oxygen Therapy: Patient Spontanous Breathing and Patient connected to face mask oxygen  Post-op Assessment: Report given to RN and Post -op Vital signs reviewed and stable  Post vital signs: Reviewed and stable  Last Vitals:  Filed Vitals:   07/05/14 0632  BP: 151/53  Pulse: 55  Temp: 36.9 C  Resp: 21    Complications: No apparent anesthesia complications

## 2014-07-05 NOTE — Op Note (Signed)
Alexandra Macias, Alexandra Macias                 ACCOUNT NO.:  [id number]  MEDICAL RECORD NO.:  [id number]  LOCATION:                                 FACILITY:  PHYSICIAN:  Wylene Simmer, MD             DATE OF BIRTH:  DATE OF PROCEDURE:  07/05/2014 DATE OF DISCHARGE:                              OPERATIVE REPORT   PREOPERATIVE DIAGNOSIS:  Painful hardware at the right talus navicular and calcaneus, status post talonavicular and subtalar arthrodesis.  POSTOPERATIVE DIAGNOSIS:  Painful hardware at the right talus navicular and calcaneus, status post talonavicular and subtalar arthrodesis.  PROCEDURE: 1. Removal of deep implants from the right talus. 2. Removal of deep implants from the right navicular through a     separate medial incision. 3. Removal of deep implants from the right calcaneus through a     separate posterior incision. 4. A 3-view radiographs of the right foot.  SURGEON:  Wylene Simmer, MD  ANESTHESIA:  General.  ESTIMATED BLOOD LOSS:  Minimal.  TOURNIQUET TIME:  73 minutes with an ankle Esmarch.  COMPLICATIONS:  None apparent.  DISPOSITION:  Extubated, awake, and stable to recovery.  INDICATIONS FOR PROCEDURE:  The patient is a 54 year old woman, who underwent talonavicular and subtalar joint arthrodesis about a year and half ago.  She has developed pain at the ankle and has symptomatic hardware across the dorsum of the talonavicular joint.  She also has a painful hardware medially at the navicular and posteriorly at the calcaneus.  She desires removal of the hardware.  CT scan reveals solid fusion across the talonavicular joint, but a fibrous union across the subtalar joint.  She declines the option of revision subtalar arthrodesis.  She understands the risks and benefits, the alternative treatment options, and elects surgical treatment.  She specifically understands risks of bleeding, infection, nerve damage, blood clots, need for additional surgery, continued pain,  progressive deformity, amputation, and death.  PROCEDURE IN DETAIL:  After preoperative consent was obtained and the correct operative site was identified, the patient was brought to the operating room and placed supine on the operating table.  General anesthesia was induced.  Preoperative antibiotics were administered. Surgical time-out was taken.  The right lower extremity was prepped and draped in standard sterile fashion.  The foot was exsanguinated and an Esmarch tourniquet was wrapped around the ankle.  The patient's dorsal incision was identified.  The incision was made.  Sharp dissection was carried down through the skin and subcutaneous tissue.  The extensor hallucis longus and tibialis anterior tendons were identified and protected.  The neurovascular bundle was also protected and retracted laterally.  Superficial aspect of the talonavicular plate was identified.  It was cleaned of all soft tissue.  All 4 screws were removed after clearing the heads of fibrous tissue.  The plate was then removed in its entirety.  The wound was irrigated and closed with Monocryl and nylon.  Attention was then turned to the medial aspect of the navicular, where the previous incision was identified.  The incision was made.  Sharp dissection was carried down through skin and subcutaneous tissue.  The dorsal screw  was identified.  A guide pin was placed down the screw and the screw was removed in its entirety.  This was repeated for the more plantar screw.  Both were removed in their entirety.  Wound was irrigated and closed with Monocryl and nylon.  Attention was then turned to the posterior aspect of the heel, where the patient's previous incision was identified.  The incision was made again and a guide pin was introduced into the more lateral of the 2 screws. The screw was removed in its entirety.  The more medial screw was identified.  Using lateral and Harris heel radiographs, the guide  pin was placed into the screw.  The countersink was then used to remove overgrown bone and the screwdriver was used to remove the screw in its entirety.  The wound was irrigated copiously and closed with nylon. Final AP foot, Harris heel, and lateral radiographs of the right foot were obtained showing complete removal of all hardware.  Sterile dressings were applied followed by a compressive wrap.  The tourniquet was released prior to closing the wounds, and hemostasis was achieved prior to closure.  The patient was then awakened from anesthesia and transported to the recovery room in stable condition.  FOLLOWUP PLAN:  The patient will be weightbearing as tolerated on her right foot in a postop shoe.  She will remove her dressings in 3-4 days. She will follow up with me in 2 weeks for suture removal.  RADIOGRAPHS:  AP foot, lateral foot, and Harris heel radiographs were obtained intraoperatively.  These show interval removal of all hardware across the talonavicular and subtalar joints.  No acute injuries noted.     Wylene Simmer, MD     JH/MEDQ  D:  07/05/2014  T:  07/05/2014  Job:  [id number]

## 2014-07-05 NOTE — Anesthesia Procedure Notes (Signed)
Procedure Name: LMA Insertion Date/Time: 07/05/2014 7:33 AM Performed by: Jayline Kilburg D Pre-anesthesia Checklist: Patient identified, Emergency Drugs available, Suction available and Patient being monitored Patient Re-evaluated:Patient Re-evaluated prior to inductionOxygen Delivery Method: Circle System Utilized Preoxygenation: Pre-oxygenation with 100% oxygen Intubation Type: IV induction Ventilation: Mask ventilation without difficulty LMA: LMA inserted LMA Size: 4.0 Number of attempts: 1 Airway Equipment and Method: Bite block Placement Confirmation: positive ETCO2 Tube secured with: Tape Dental Injury: Teeth and Oropharynx as per pre-operative assessment

## 2014-09-26 ENCOUNTER — Emergency Department
Admission: EM | Admit: 2014-09-26 | Discharge: 2014-09-26 | Disposition: A | Discharge status: Home / Self Care | Payer: BLUE CROSS/BLUE SHIELD | Attending: Emergency Medicine | Admitting: Emergency Medicine

## 2014-09-26 ENCOUNTER — Encounter: Payer: Self-pay | Admitting: Emergency Medicine

## 2014-09-26 ENCOUNTER — Emergency Department: Payer: BLUE CROSS/BLUE SHIELD

## 2014-09-26 DIAGNOSIS — R0789 Other chest pain: Secondary | ICD-10-CM | POA: Insufficient documentation

## 2014-09-26 DIAGNOSIS — B349 Viral infection, unspecified: Secondary | ICD-10-CM | POA: Insufficient documentation

## 2014-09-26 DIAGNOSIS — R079 Chest pain, unspecified: Secondary | ICD-10-CM | POA: Diagnosis present

## 2014-09-26 DIAGNOSIS — Z87891 Personal history of nicotine dependence: Secondary | ICD-10-CM | POA: Insufficient documentation

## 2014-09-26 DIAGNOSIS — R05 Cough: Secondary | ICD-10-CM

## 2014-09-26 DIAGNOSIS — R059 Cough, unspecified: Secondary | ICD-10-CM

## 2014-09-26 LAB — BASIC METABOLIC PANEL
ANION GAP: 9 (ref 5–15)
BUN: 19 mg/dL (ref 6–20)
CALCIUM: 9.2 mg/dL (ref 8.9–10.3)
CO2: 25 mmol/L (ref 22–32)
Chloride: 105 mmol/L (ref 101–111)
Creatinine, Ser: 0.62 mg/dL (ref 0.44–1.00)
GFR calc Af Amer: >60 mL/min (ref >60)
GLUCOSE: 126 mg/dL — AB (ref 65–99)
POTASSIUM: 3.8 mmol/L (ref 3.5–5.1)
Sodium: 139 mmol/L (ref 135–145)

## 2014-09-26 LAB — CBC
HEMATOCRIT: 40.3 % (ref 35.0–47.0)
HEMOGLOBIN: 13.1 g/dL (ref 12.0–16.0)
MCH: 25.3 pg — ABNORMAL LOW (ref 26.0–34.0)
MCHC: 32.3 g/dL (ref 32.0–36.0)
MCV: 78.2 fL — ABNORMAL LOW (ref 80.0–100.0)
Platelets: 289 10*3/uL (ref 150–440)
RBC: 5.16 MIL/uL (ref 3.80–5.20)
RDW: 15 % — ABNORMAL HIGH (ref 11.5–14.5)
WBC: 7.6 10*3/uL (ref 3.6–11.0)

## 2014-09-26 LAB — TROPONIN I: Troponin I: <0.03 ng/mL

## 2014-09-26 LAB — BRAIN NATRIURETIC PEPTIDE: B Natriuretic Peptide: 18 pg/mL (ref 0.0–100.0)

## 2014-09-26 MED ORDER — BENZONATATE 200 MG PO CAPS: 200.0000 mg | ORAL_CAPSULE | Freq: Three times a day (TID) | ORAL | Status: DC | PRN

## 2014-09-26 MED ORDER — BENZONATATE 100 MG PO CAPS
200.0000 mg | ORAL_CAPSULE | ORAL | Status: AC
  Administered 2014-09-26: 200 mg via ORAL
  Filled 2014-09-26: qty 2

## 2014-09-26 MED ORDER — BENZONATATE 100 MG PO CAPS
ORAL_CAPSULE | ORAL | Status: AC
  Administered 2014-09-26: 200 mg via ORAL
  Filled 2014-09-26: qty 2

## 2014-09-26 NOTE — ED Provider Notes (Signed)
[facility] Emergency Department Provider Note  ____________________________________________  Time seen: Approximately 8:49 PM  I have reviewed the triage vital signs and the nursing notes.   HISTORY  Chief Complaint Chest Pain; Cough; Nasal Congestion; and Generalized Body Aches    HPI Alexandra Macias is a 54 y.o. female who reports no significant past medical history who presents with several days of viral symptoms that include a frequent cough, nasal congestion, rib pain which she describes as being a result of her cough, and occasional body aches.  She denies fever and chills.  She states that the body aches are gotten better but because of her cough she continues to be uncomfortable.  She describes the cough is moderate to severe and the accompanying chest wall discomfort as mild to moderate at times.  She denies nausea, vomiting, abdominal pain, and dysuria.  The cough and other viral symptoms were gradual in onset.  Nothing makes it better and nothing makes it worse.The chest wall discomfort is all throughout her rib cage   Past Medical History  Diagnosis Date  . Medical history non-contributory   . DVT (deep venous thrombosis) 2005    post op hysterectomy    There are no active problems to display for this patient.   Past Surgical History  Procedure Laterality Date  . Foot fusion  2015    right-fx  . Orif wrist fracture      both rt and lt  . Orif foot fracture  2010    left  . Abdominal hysterectomy  2005  . Hardware removal Right 07/05/2014    Procedure: REMOVAL OF DEEP IMPLANTS OF RIGHT ANKLE;  Surgeon: Wylene Simmer, MD;  Location: [place];  Service: Orthopedics;  Laterality: Right;    Current Outpatient Rx  Name  Route  Sig  Dispense  Refill  .           .           . ibuprofen (ADVIL,MOTRIN) 200 MG tablet   Oral   Take 200 mg by mouth every 6 (six) hours as needed.           Allergies Review of patient's  allergies indicates no known allergies.  History reviewed. No pertinent family history.  Social History History  Substance Use Topics  . Smoking status: Former Smoker    Quit date: 12/26/2013  . Smokeless tobacco: Not on file  . Alcohol Use: Yes     Comment: rare    Review of Systems Constitutional: No fever/chills.  Occasional myalgias Eyes: No visual changes. ENT: No sore throat. Cardiovascular: Chest wall pain when she coughs Respiratory: Denies shortness of breath.  Frequent cough, dry Gastrointestinal: No abdominal pain.  No nausea, no vomiting.  No diarrhea.  No constipation. Genitourinary: Negative for dysuria. Musculoskeletal: Negative for back pain. Skin: Negative for rash. Neurological: Negative for headaches, focal weakness or numbness.  10-point ROS otherwise negative.  ____________________________________________   PHYSICAL EXAM:  VITAL SIGNS: ED Triage Vitals  Enc Vitals Group     BP 09/26/14 1836 152/72 mmHg     Pulse Rate 09/26/14 1836 82     Resp 09/26/14 1836 20     Temp 09/26/14 1836 98.2 F (36.8 C)     Temp Source 09/26/14 1836 Oral     SpO2 09/26/14 1836 98 %     Weight 09/26/14 1836 320 lb (145.151 kg)     Height 09/26/14 1836 5\' 7"  (1.702 m)  Head Cir --      Peak Flow --      Pain Score 09/26/14 1837 5     Pain Loc --      Pain Edu? --      Excl. in [place]? --     Constitutional: Alert and oriented. Well appearing and in no acute distress. Eyes: Conjunctivae are normal. PERRL. EOMI. Head: Atraumatic. Nose: Nasal congestion. Mouth/Throat: Mucous membranes are moist.  Oropharynx non-erythematous. Neck: No stridor.   Cardiovascular: Normal rate, regular rhythm. Grossly normal heart sounds.  Good peripheral circulation.  Highly reproducible chest wall tenderness throughout the rib cage. Respiratory: Normal respiratory effort.  No retractions. Lungs CTAB.  Frequent dry cough Gastrointestinal: Soft and nontender. No distention. No  abdominal bruits. No CVA tenderness. Musculoskeletal: No lower extremity tenderness nor edema.  No joint effusions. Neurologic:  Normal speech and language. No gross focal neurologic deficits are appreciated. Speech is normal. Skin:  Skin is warm, dry and intact. No rash noted. Psychiatric: Mood and affect are normal. Speech and behavior are normal.  ____________________________________________   LABS (all labs ordered are listed, but only abnormal results are displayed)  Labs Reviewed  CBC - Abnormal; Notable for the following:    MCV 78.2 (*)    MCH 25.3 (*)    RDW 15.0 (*)    All other components within normal limits  BASIC METABOLIC PANEL - Abnormal; Notable for the following:    Glucose, Bld 126 (*)    All other components within normal limits  BRAIN NATRIURETIC PEPTIDE  TROPONIN I   ____________________________________________  EKG  ED ECG REPORT I, Thurmond Hildebran, the attending physician, personally viewed and interpreted this ECG.   Date: 09/26/2014  EKG Time: 18:45  Rate: 76  Rhythm: normal sinus rhythm  Axis: Normal  Intervals:Normal  ST&T Change: Inverted T-wave in lead 3 and V3, otherwise unremarkable  ____________________________________________  RADIOLOGY  I, Renny Remer, personally viewed and evaluated these images as part of my medical decision making.    Dg Chest 2 View  09/26/2014   CLINICAL DATA:  Cough and chest pain  EXAM: CHEST  2 VIEW  COMPARISON:  November 28, 2005  FINDINGS: Lungs are clear. Heart size and pulmonary vascularity are normal. No adenopathy. No pneumothorax. No bone lesions.  IMPRESSION: No edema or consolidation.   Electronically Signed   By: Lowella Grip III M.D.   On: 09/26/2014 19:22    ____________________________________________   PROCEDURES  Procedure(s) performed: None  Critical Care performed: No ____________________________________________   INITIAL IMPRESSION / ASSESSMENT AND PLAN / ED COURSE  Pertinent  labs & imaging results that were available during my care of the patient were reviewed by me and considered in my medical decision making (see chart for details).  The patient's evaluation including labs, EKG, chest x-ray, and physical exam are all reassuring.  She is not in any respiratory distress and has clear lung sounds.  Her chest wall tenderness is consistent with muscle strain from her frequent strenuous cough.  I have treated her with Tessalon to help with the cough and advised the use of over-the-counter Tylenol and ibuprofen.  I advised close outpatient follow-up and gave my usual and customary return precautions.  The patient understands and agrees.  I have a very low suspicion that these symptoms represent ACS, PE, or other possible emergent medical condition.  ____________________________________________  FINAL CLINICAL IMPRESSION(S) / ED DIAGNOSES  Final diagnoses:  Viral syndrome  Cough  Chest wall pain  NEW MEDICATIONS STARTED DURING THIS VISIT:  Discharge Medication List as of 09/26/2014  9:54 PM    START taking these medications   Details  benzonatate (TESSALON) 200 MG capsule Take 1 capsule (200 mg total) by mouth 3 (three) times daily as needed for cough., Starting 09/26/2014, Until Discontinued, Print         Hinda Kehr, MD 09/27/14 0000

## 2014-09-26 NOTE — Discharge Instructions (Signed)
You have been seen in the Emergency Department (ED) today for a likely viral illness.  Please drink plenty of clear fluids (water, Gatorade, chicken broth, etc).  You may use Tylenol and/or Motrin according to label instructions.  You can alternate between the two without any side effects.  Try taking the cough medicine and take over-the-counter ibuprofen and Tylenol as needed to help with the soreness in her chest from all the coughing.    Please follow up with your doctor as listed above.  Call your doctor or return to the Emergency Department (ED) if you are unable to tolerate fluids due to vomiting, have worsening trouble breathing, become extremely tired or difficult to awaken, or if you develop any other symptoms that concern you.   Viral Infections A viral infection can be caused by different types of viruses.Most viral infections are not serious and resolve on their own. However, some infections may cause severe symptoms and may lead to further complications. SYMPTOMS Viruses can frequently cause:  Minor sore throat.  Aches and pains.  Headaches.  Runny nose.  Different types of rashes.  Watery eyes.  Tiredness.  Cough.  Loss of appetite.  Gastrointestinal infections, resulting in nausea, vomiting, and diarrhea. These symptoms do not respond to antibiotics because the infection is not caused by bacteria. However, you might catch a bacterial infection following the viral infection. This is sometimes called a "superinfection." Symptoms of such a bacterial infection may include:  Worsening sore throat with pus and difficulty swallowing.  Swollen neck glands.  Chills and a high or persistent fever.  Severe headache.  Tenderness over the sinuses.  Persistent overall ill feeling (malaise), muscle aches, and tiredness (fatigue).  Persistent cough.  Yellow, green, or brown mucus production with coughing. HOME CARE INSTRUCTIONS   Only take over-the-counter or  prescription medicines for pain, discomfort, diarrhea, or fever as directed by your caregiver.  Drink enough water and fluids to keep your urine clear or pale yellow. Sports drinks can provide valuable electrolytes, sugars, and hydration.  Get plenty of rest and maintain proper nutrition. Soups and broths with crackers or rice are fine. SEEK IMMEDIATE MEDICAL CARE IF:   You have severe headaches, shortness of breath, chest pain, neck pain, or an unusual rash.  You have uncontrolled vomiting, diarrhea, or you are unable to keep down fluids.  You or your child has an oral temperature above 102 F (38.9 C), not controlled by medicine.  Your baby is older than 3 months with a rectal temperature of 102 F (38.9 C) or higher.  Your baby is 29 months old or younger with a rectal temperature of 100.4 F (38 C) or higher. MAKE SURE YOU:   Understand these instructions.  Will watch your condition.  Will get help right away if you are not doing well or get worse. Document Released: 12/31/2004 Document Revised: 06/15/2011 Document Reviewed: 07/28/2010 [facility] Patient Information 2015 [place], [place]. This information is not intended to replace advice given to you by your health care provider. Make sure you discuss any questions you have with your health care provider.  Cough, Adult  A cough is a reflex that helps clear your throat and airways. It can help heal the body or may be a reaction to an irritated airway. A cough may only last 2 or 3 weeks (acute) or may last more than 8 weeks (chronic).  CAUSES Acute cough:  Viral or bacterial infections. Chronic cough:  Infections.  Allergies.  Asthma.  Post-nasal drip.  Smoking.  Heartburn or acid reflux.  Some medicines.  Chronic lung problems (COPD).  Cancer. SYMPTOMS   Cough.  Fever.  Chest pain.  Increased breathing rate.  High-pitched whistling sound when breathing (wheezing).  Colored mucus that you cough up  (sputum). TREATMENT   A bacterial cough may be treated with antibiotic medicine.  A viral cough must run its course and will not respond to antibiotics.  Your caregiver may recommend other treatments if you have a chronic cough. HOME CARE INSTRUCTIONS   Only take over-the-counter or prescription medicines for pain, discomfort, or fever as directed by your caregiver. Use cough suppressants only as directed by your caregiver.  Use a cold steam vaporizer or humidifier in your bedroom or home to help loosen secretions.  Sleep in a semi-upright position if your cough is worse at night.  Rest as needed.  Stop smoking if you smoke. SEEK IMMEDIATE MEDICAL CARE IF:   You have pus in your sputum.  Your cough starts to worsen.  You cannot control your cough with suppressants and are losing sleep.  You begin coughing up blood.  You have difficulty breathing.  You develop pain which is getting worse or is uncontrolled with medicine.  You have a fever. MAKE SURE YOU:   Understand these instructions.  Will watch your condition.  Will get help right away if you are not doing well or get worse. Document Released: 09/19/2010 Document Revised: 06/15/2011 Document Reviewed: 09/19/2010 [facility] Patient Information 2015 [place], [place]. This information is not intended to replace advice given to you by your health care provider. Make sure you discuss any questions you have with your health care provider.  Chest Wall Pain Chest wall pain is pain in or around the bones and muscles of your chest. It may take up to 6 weeks to get better. It may take longer if you must stay physically active in your work and activities.  CAUSES  Chest wall pain may happen on its own. However, it may be caused by:  A viral illness like the flu.  Injury.  Coughing.  Exercise.  Arthritis.  Fibromyalgia.  Shingles. HOME CARE INSTRUCTIONS   Avoid overtiring physical activity. Try not to strain or  perform activities that cause pain. This includes any activities using your chest or your abdominal and side muscles, especially if heavy weights are used.  Put ice on the sore area.  Put ice in a plastic bag.  Place a towel between your skin and the bag.  Leave the ice on for 15-20 minutes per hour while awake for the first 2 days.  Only take over-the-counter or prescription medicines for pain, discomfort, or fever as directed by your caregiver. SEEK IMMEDIATE MEDICAL CARE IF:   Your pain increases, or you are very uncomfortable.  You have a fever.  Your chest pain becomes worse.  You have new, unexplained symptoms.  You have nausea or vomiting.  You feel sweaty or lightheaded.  You have a cough with phlegm (sputum), or you cough up blood. MAKE SURE YOU:   Understand these instructions.  Will watch your condition.  Will get help right away if you are not doing well or get worse. Document Released: 03/23/2005 Document Revised: 06/15/2011 Document Reviewed: 11/17/2010 [facility] Patient Information 2015 [place], [place]. This information is not intended to replace advice given to you by your health care provider. Make sure you discuss any questions you have with your health care provider.

## 2014-09-26 NOTE — ED Notes (Signed)
Patient arrrives to ED from home with c/o cough, congestion, chest pain, body aches, chills

## 2014-09-26 NOTE — ED Notes (Signed)
Pt coughing and reporting rib pain with coughing.

## 2017-04-23 ENCOUNTER — Other Ambulatory Visit: Payer: Self-pay

## 2017-04-23 ENCOUNTER — Emergency Department
Admission: EM | Admit: 2017-04-23 | Discharge: 2017-04-23 | Disposition: A | Discharge status: Home / Self Care | Payer: BLUE CROSS/BLUE SHIELD | Attending: Emergency Medicine | Admitting: Emergency Medicine

## 2017-04-23 ENCOUNTER — Emergency Department: Payer: BLUE CROSS/BLUE SHIELD

## 2017-04-23 DIAGNOSIS — R062 Wheezing: Secondary | ICD-10-CM | POA: Diagnosis not present

## 2017-04-23 DIAGNOSIS — R05 Cough: Secondary | ICD-10-CM | POA: Diagnosis present

## 2017-04-23 DIAGNOSIS — Z87891 Personal history of nicotine dependence: Secondary | ICD-10-CM | POA: Insufficient documentation

## 2017-04-23 DIAGNOSIS — J40 Bronchitis, not specified as acute or chronic: Secondary | ICD-10-CM | POA: Diagnosis not present

## 2017-04-23 MED ORDER — ALBUTEROL SULFATE HFA 108 (90 BASE) MCG/ACT IN AERS: 2.0000 | INHALATION_SPRAY | RESPIRATORY_TRACT | 1 refills | Status: AC | PRN

## 2017-04-23 MED ORDER — IPRATROPIUM-ALBUTEROL 0.5-2.5 (3) MG/3ML IN SOLN
3.0000 mL | Freq: Once | RESPIRATORY_TRACT | Status: AC
  Administered 2017-04-23: 3 mL via RESPIRATORY_TRACT
  Filled 2017-04-23: qty 3

## 2017-04-23 MED ORDER — PREDNISONE 10 MG PO TABS: 50.0000 mg | ORAL_TABLET | Freq: Every day | ORAL | 0 refills | Status: DC

## 2017-04-23 MED ORDER — METHYLPREDNISOLONE SODIUM SUCC 125 MG IJ SOLR
125.0000 mg | Freq: Once | INTRAMUSCULAR | Status: AC
  Administered 2017-04-23: 125 mg via INTRAVENOUS
  Filled 2017-04-23: qty 2

## 2017-04-23 NOTE — ED Triage Notes (Signed)
Pt c/o cough with congestion for the past couple of days

## 2017-04-23 NOTE — ED Notes (Signed)
See triage note. Presents with prod cough and wheezing which started 2 days ago  Afebrile on arrival  Positive exertional SOB

## 2017-04-23 NOTE — ED Provider Notes (Signed)
[facility] Emergency Department Provider Note  ____________________________________________  Time seen: Approximately 4:16 PM  I have reviewed the triage vital signs and the nursing notes.   HISTORY  Chief Complaint URI   HPI Alexandra Macias is a 57 y.o. female who presents to the emergency department for evaluation and treatment of cough and wheezing that started 2 days ago.  She states that yesterday, the wheezing got worse.  She states that she has some shortness of breath with exertion.  Over-the-counter medications have not given her any relief.  Past Medical History:  Diagnosis Date  . DVT (deep venous thrombosis) ([place]) 2005   post op hysterectomy  . Medical history non-contributory     There are no active problems to display for this patient.   Past Surgical History:  Procedure Laterality Date  . ABDOMINAL HYSTERECTOMY  2005  . FOOT FUSION  2015   right-fx  . HARDWARE REMOVAL Right 07/05/2014   Procedure: REMOVAL OF DEEP IMPLANTS OF RIGHT ANKLE;  Surgeon: Wylene Simmer, MD;  Location: [place];  Service: Orthopedics;  Laterality: Right;  . ORIF FOOT FRACTURE  2010   left  . ORIF WRIST FRACTURE     both rt and lt    Prior to Admission medications   Medication Sig Start Date End Date Taking? Authorizing Provider  albuterol (PROVENTIL HFA;VENTOLIN HFA) 108 (90 Base) MCG/ACT inhaler Inhale 2 puffs into the lungs every 4 (four) hours as needed for wheezing or shortness of breath. 04/23/17   Bennette Hasty B, FNP  benzonatate (TESSALON) 200 MG capsule Take 1 capsule (200 mg total) by mouth 3 (three) times daily as needed for cough. 09/26/14   Hinda Kehr, MD  HYDROcodone-acetaminophen (NORCO) 5-325 MG per tablet Take 1-2 tablets by mouth every 6 (six) hours as needed. 07/05/14   Wylene Simmer, MD  ibuprofen (ADVIL,MOTRIN) 200 MG tablet Take 200 mg by mouth every 6 (six) hours as needed.    [provider]  predniSONE  (DELTASONE) 10 MG tablet Take 5 tablets (50 mg total) by mouth daily. 04/23/17   Victorino Dike, FNP    Allergies Patient has no known allergies.  No family history on file.  Social History Social History   Tobacco Use  . Smoking status: Former Smoker    Last attempt to quit: 12/26/2013    Years since quitting: 3.3  . Smokeless tobacco: Never Used  Substance Use Topics  . Alcohol use: Yes    Comment: rare  . Drug use: No    Review of Systems Constitutional: Negative fever/chills ENT: Negative for sore throat. Cardiovascular: Denies chest pain. Respiratory: Positive for shortness of breath. Positive for cough. Gastrointestinal: Negative for nausea, no vomiting.  No diarrhea.  Musculoskeletal: Negative for body aches Skin: Negative for rash. Neurological: Negative for headaches ____________________________________________   PHYSICAL EXAM:  VITAL SIGNS: ED Triage Vitals  Enc Vitals Group     BP 04/23/17 1318 (!) 155/79     Pulse Rate 04/23/17 1318 85     Resp 04/23/17 1318 18     Temp 04/23/17 1318 98.5 F (36.9 C)     Temp Source 04/23/17 1318 Oral     SpO2 04/23/17 1318 96 %     Weight 04/23/17 1319 (!) 320 lb (145.2 kg)     Height 04/23/17 1319 5\' 7"  (1.702 m)     Head Circumference --      Peak Flow --      Pain Score --  Pain Loc --      Pain Edu? --      Excl. in [place]? --     Constitutional: Alert and oriented.  Acutely ill appearing and in no acute distress. Eyes: Conjunctivae are normal. EOMI. Ears: Bilateral tympanic membranes are normal. Nose: No sinus congestion noted; no rhinnorhea. Mouth/Throat: Mucous membranes are moist.  Oropharynx clear. Tonsils 1+ without exudate. Neck: No stridor.  Lymphatic: No cervical lymphadenopathy. Cardiovascular: Normal rate, regular rhythm. Good peripheral circulation. Respiratory: Normal respiratory effort.  No retractions.  Expiratory wheezes present throughout. Gastrointestinal: Soft and nontender.   Musculoskeletal: FROM x 4 extremities.  Neurologic:  Normal speech and language.  Skin:  Skin is warm, dry and intact. No rash noted. Psychiatric: Mood and affect are normal. Speech and behavior are normal.  ____________________________________________   LABS (all labs ordered are listed, but only abnormal results are displayed)  Labs Reviewed - No data to display ____________________________________________  EKG  Not indicated ____________________________________________  RADIOLOGY  Chest x-ray is negative for acute cardiopulmonary abnormality per radiology. ____________________________________________   PROCEDURES  Procedure(s) performed: None  Critical Care performed: No ____________________________________________   INITIAL IMPRESSION / ASSESSMENT AND PLAN / ED COURSE  57 year old female presenting to the emergency department for evaluation and treatment of shortness of breath and wheezing with cough.  She received a DuoNeb treatment and Solu-Medrol injection while here with a significant decrease in wheezing.  She will be discharged home with prescription for albuterol and prednisone.  She is to follow-up with the primary care provider for choice for symptoms that are not improving over the next few days.  She was instructed to return to the emergency department for symptoms of change or worsen if she is unable to schedule an appointment.  Medications  ipratropium-albuterol (DUONEB) 0.5-2.5 (3) MG/3ML nebulizer solution 3 mL (3 mLs Nebulization Given 04/23/17 1432)  methylPREDNISolone sodium succinate (SOLU-MEDROL) 125 mg/2 mL injection 125 mg (125 mg Intravenous Given 04/23/17 1432)    ED Discharge Orders        Ordered    predniSONE (DELTASONE) 10 MG tablet  Daily     04/23/17 1516    albuterol (PROVENTIL HFA;VENTOLIN HFA) 108 (90 Base) MCG/ACT inhaler  Every 4 hours PRN     04/23/17 1516      Pertinent labs & imaging results that were available during my care  of the patient were reviewed by me and considered in my medical decision making (see chart for details).    If controlled substance prescribed during this visit, 12 month history viewed on the [place] prior to issuing an initial prescription for Schedule II or III opiod. ____________________________________________   FINAL CLINICAL IMPRESSION(S) / ED DIAGNOSES  Final diagnoses:  Bronchitis    Note:  This document was prepared using Dragon voice recognition software and may include unintentional dictation errors.     Victorino Dike, FNP 04/23/17 1620    Harvest Dark, MD 04/28/17 1947

## 2017-07-07 ENCOUNTER — Other Ambulatory Visit: Payer: Self-pay | Admitting: Family Medicine

## 2017-08-23 ENCOUNTER — Ambulatory Visit
Admission: RE | Admit: 2017-08-23 | Discharge: 2017-08-23 | Disposition: A | Discharge status: Home / Self Care | Payer: Self-pay | Source: Ambulatory Visit | Attending: Oncology | Admitting: Oncology

## 2017-08-23 ENCOUNTER — Ambulatory Visit: Payer: Self-pay | Attending: Oncology | Admitting: *Deleted

## 2017-08-23 VITALS — BP 136/81 | HR 57 | Temp 95.9°F | Ht 66.0 in | Wt 338.0 lb

## 2017-08-23 DIAGNOSIS — Z0000 Encounter for general adult medical examination without abnormal findings: Secondary | ICD-10-CM | POA: Insufficient documentation

## 2017-08-23 NOTE — Progress Notes (Signed)
  Subjective:     Patient ID: Alexandra Macias, female   DOB: 01/28/1961, 57 y.o.   MRN: [id number]  HPI   Review of Systems     Objective:   Physical Exam  Pulmonary/Chest: Right breast exhibits no inverted nipple, no mass, no nipple discharge, no skin change and no tenderness. Left breast exhibits no inverted nipple, no mass, no nipple discharge, no skin change and no tenderness.         Assessment:     57 year old Black female presents to [facility] for clinical breast exam and mammogram only. She is the sister of Meredith Mody, who works in our [employer] in the [employer] Clinical breast exam unremarkable.  Patient has very large pendulous breast.  Taught self breast awareness.  Patient has some difficulty walking.  States she has a bad knee.  Patient has been screened for eligibility.  She does not have any insurance, Medicare or Medicaid.  She also meets financial eligibility.  Hand-out given on the Affordable Care Act.    Plan:     Screening mammogram ordered.  Will follow-up per [facility] protocol.

## 2017-08-23 NOTE — Patient Instructions (Signed)
Gave patient hand-out, Women Staying Healthy, Active and Well from [facility], with education on breast health, pap smears, heart and colon health. 

## 2017-08-24 ENCOUNTER — Encounter: Payer: Self-pay | Admitting: *Deleted

## 2017-08-24 NOTE — Progress Notes (Signed)
Letter mailed from [facility] to inform patient of her normal mammogram results.  Patient is to follow-up with annual screening in one year.  HSIS to [place]. 

## 2017-08-25 ENCOUNTER — Emergency Department: Payer: Self-pay

## 2017-08-25 ENCOUNTER — Emergency Department
Admission: EM | Admit: 2017-08-25 | Discharge: 2017-08-25 | Disposition: A | Discharge status: Home / Self Care | Payer: Self-pay | Attending: Emergency Medicine | Admitting: Emergency Medicine

## 2017-08-25 ENCOUNTER — Encounter: Payer: Self-pay | Admitting: Emergency Medicine

## 2017-08-25 ENCOUNTER — Other Ambulatory Visit: Payer: Self-pay

## 2017-08-25 DIAGNOSIS — M1712 Unilateral primary osteoarthritis, left knee: Secondary | ICD-10-CM | POA: Insufficient documentation

## 2017-08-25 DIAGNOSIS — I10 Essential (primary) hypertension: Secondary | ICD-10-CM | POA: Insufficient documentation

## 2017-08-25 DIAGNOSIS — M19171 Post-traumatic osteoarthritis, right ankle and foot: Secondary | ICD-10-CM | POA: Insufficient documentation

## 2017-08-25 DIAGNOSIS — Z87891 Personal history of nicotine dependence: Secondary | ICD-10-CM | POA: Insufficient documentation

## 2017-08-25 HISTORY — DX: Essential (primary) hypertension: I10

## 2017-08-25 MED ORDER — KETOROLAC TROMETHAMINE 30 MG/ML IJ SOLN
30.0000 mg | Freq: Once | INTRAMUSCULAR | Status: AC
  Administered 2017-08-25: 30 mg via INTRAMUSCULAR
  Filled 2017-08-25: qty 1

## 2017-08-25 MED ORDER — TRAMADOL HCL 50 MG PO TABS: 50.0000 mg | ORAL_TABLET | Freq: Four times a day (QID) | ORAL | 0 refills | Status: DC | PRN

## 2017-08-25 MED ORDER — MELOXICAM 15 MG PO TABS: 15.0000 mg | ORAL_TABLET | Freq: Every day | ORAL | 0 refills | Status: AC

## 2017-08-25 NOTE — ED Triage Notes (Signed)
Says pain right ankle and has pins in it.  Also pain in left knee. No injury recalled.  Right foot x 1 wee.  Left knee long time

## 2017-08-25 NOTE — ED Provider Notes (Signed)
[facility] Emergency Department Provider Note  ____________________________________________   First MD Initiated Contact with Patient 08/25/17 [phone]     (approximate)  I have reviewed the triage vital signs and the nursing notes.   HISTORY  Chief Complaint Knee Pain and Ankle Pain  HPI Alexandra Macias is a 57 y.o. female is here complaint of right ankle pain and left knee pain.  Patient states that there is been no injury.  She states that she has had surgery on both her feet.  She states that her right foot began hurting 1 week ago when she is worried that the "pins have moved".  She has not taken any over-the-counter medication for inflammation.  She states that one foot was operated on in [place] and the other one was done by Dr. Vickki Muff at [facility].  She rates her pain as a 10/10.   Past Medical History:  Diagnosis Date  . DVT (deep venous thrombosis) ([place]) 2005   post op hysterectomy  . Hypertension   . Medical history non-contributory     There are no active problems to display for this patient.   Past Surgical History:  Procedure Laterality Date  . ABDOMINAL HYSTERECTOMY  2005  . FOOT FUSION  2015   right-fx  . HARDWARE REMOVAL Right 07/05/2014   Procedure: REMOVAL OF DEEP IMPLANTS OF RIGHT ANKLE;  Surgeon: Wylene Simmer, MD;  Location: [place];  Service: Orthopedics;  Laterality: Right;  . ORIF FOOT FRACTURE  2010   left  . ORIF WRIST FRACTURE     both rt and lt    Prior to Admission medications   Medication Sig Start Date End Date Taking? Authorizing Provider  albuterol (PROVENTIL HFA;VENTOLIN HFA) 108 (90 Base) MCG/ACT inhaler Inhale 2 puffs into the lungs every 4 (four) hours as needed for wheezing or shortness of breath. 04/23/17   Triplett, Johnette Abraham B, FNP  meloxicam (MOBIC) 15 MG tablet Take 1 tablet (15 mg total) by mouth daily. 08/25/17 08/25/18  Johnn Hai, PA-C  traMADol (ULTRAM) 50 MG tablet Take 1  tablet (50 mg total) by mouth every 6 (six) hours as needed. 08/25/17   Johnn Hai, PA-C    Allergies Patient has no known allergies.  Family History  Problem Relation Age of Onset  . Breast cancer Neg Hx     Social History Social History   Tobacco Use  . Smoking status: Former Smoker    Last attempt to quit: 12/26/2013    Years since quitting: 3.6  . Smokeless tobacco: Never Used  Substance Use Topics  . Alcohol use: Yes    Comment: rare  . Drug use: No    Review of Systems Constitutional: No fever/chills Cardiovascular: Denies chest pain. Respiratory: Denies shortness of breath. Musculoskeletal: Positive for right ankle pain.  Positive for left knee pain. Skin: Negative for rash. Neurological: Negative for  focal weakness or numbness. ___________________________________________   PHYSICAL EXAM:  VITAL SIGNS: ED Triage Vitals  Enc Vitals Group     BP 08/25/17 0848 (!) 141/67     Pulse Rate 08/25/17 0848 74     Resp 08/25/17 0848 20     Temp 08/25/17 0848 98.3 F (36.8 C)     Temp Source 08/25/17 0848 Oral     SpO2 08/25/17 0848 95 %     Weight 08/25/17 0849 (!) 338 lb (153.3 kg)     Height 08/25/17 0849 5\' 7"  (1.702 m)  Head Circumference --      Peak Flow --      Pain Score 08/25/17 0853 10     Pain Loc --      Pain Edu? --      Excl. in [place]? --    Constitutional: Alert and oriented. Well appearing and in no acute distress.  Morbidly obese. Eyes: Conjunctivae are normal.  Head: Atraumatic. Nose: No congestion/rhinnorhea. Neck: No stridor.   Cardiovascular: Normal rate, regular rhythm. Grossly normal heart sounds.  Good peripheral circulation. Respiratory: Normal respiratory effort.  No retractions. Lungs CTAB. Musculoskeletal: On examination of the right ankle there is no gross deformity however there is moderate soft tissue swelling.  There is no ecchymosis or abrasions.  No erythema or warmth is noted in the joint.  Skin is intact.   Postsurgical scars are well-healed.  On examination of the left knee it appears degenerative in nature.  There is moderate crepitus with range of motion.  No soft tissue swelling, erythema or warmth was noted.  Motor sensory function intact distally.  Neurologic:  Normal speech and language. No gross focal neurologic deficits are appreciated.  Skin:  Skin is warm, dry and intact.  Psychiatric: Mood and affect are normal. Speech and behavior are normal.  ____________________________________________   LABS (all labs ordered are listed, but only abnormal results are displayed)  Labs Reviewed - No data to display  RADIOLOGY  ED MD interpretation:   Left knee x-ray shows moderate degenerative changes.  Right ankle x-ray shows no acute changes.  Official radiology report(s): Dg Ankle Complete Right  Result Date: 08/25/2017 CLINICAL DATA:  Right ankle pain without known injury. EXAM: RIGHT ANKLE - COMPLETE 3+ VIEW COMPARISON:  Radiographs of December 25, 2011. FINDINGS: There is no evidence of acute fracture or dislocation. Osteophyte formation is noted posteriorly to the talotibial joint. Soft tissue swelling is noted laterally. IMPRESSION: No evidence of acute fracture or dislocation. Soft tissue swelling is noted laterally. Electronically Signed   By: Marijo Conception, M.D.   On: 08/25/2017 10:09   Dg Knee Complete 4 Views Left  Result Date: 08/25/2017 CLINICAL DATA:  Chronic left knee pain without known injury. EXAM: LEFT KNEE - COMPLETE 4+ VIEW COMPARISON:  Radiographs of April 10, 2006. FINDINGS: No evidence of fracture, dislocation, or joint effusion. Severe narrowing of medial joint space is noted with osteophyte formation. Moderate narrowing of patellofemoral space is noted. Soft tissues are unremarkable. IMPRESSION: Severe degenerative joint disease medially. No acute abnormality seen in the left knee. Electronically Signed   By: Marijo Conception, M.D.   On: 08/25/2017 10:11     ____________________________________________   PROCEDURES  Procedure(s) performed: None  Procedures  Critical Care performed: No  ____________________________________________   INITIAL IMPRESSION / ASSESSMENT AND PLAN / ED COURSE  As part of my medical decision making, I reviewed the following data within the electronic MEDICAL RECORD NUMBER Notes from prior ED visits and [place] Controlled Substance Database  Patient was made aware that x-ray show degenerative changes in her knee and that she would need to follow-up with orthopedics.  Patient has seen someone in [place] and someone is not [facility].  She will decide from there where she would like to go.  Patient was started on meloxicam 50 mg 1 daily with food and tramadol as needed for moderate pain.  She is to call make an appoint with an orthopedist to discuss problems with her knee. ____________________________________________   FINAL CLINICAL IMPRESSION(S) /  ED DIAGNOSES  Final diagnoses:  Osteoarthritis of left knee, unspecified osteoarthritis type  Post-traumatic osteoarthritis of right ankle     ED Discharge Orders        Ordered    meloxicam (MOBIC) 15 MG tablet  Daily     08/25/17 1117    traMADol (ULTRAM) 50 MG tablet  Every 6 hours PRN     08/25/17 1117       Note:  This document was prepared using Dragon voice recognition software and may include unintentional dictation errors.    Johnn Hai, PA-C 08/25/17 1534    Eula Listen, MD 08/25/17 [phone]

## 2017-08-25 NOTE — ED Notes (Signed)
See triage note  Presents with pain to left knee for "awhile"   And over the past couple of days having pain to right ankle  Denies any injury

## 2017-08-25 NOTE — Discharge Instructions (Addendum)
Follow-up with your primary care provider if any continued medication as needed.  Also follow-up with Dr. Vickki Muff about your ankle pain and orthopedist when you are ready to discuss any injections to your left knee.  Begin taking meloxicam once daily with food and tramadol every 6 hours as needed for severe pain.  Do not take the tramadol and drive as this may cause drowsiness and increase your risk for injury.

## 2017-11-09 ENCOUNTER — Other Ambulatory Visit: Payer: Self-pay

## 2017-11-09 ENCOUNTER — Other Ambulatory Visit: Payer: Self-pay | Admitting: Internal Medicine

## 2017-11-09 ENCOUNTER — Other Ambulatory Visit: Payer: Self-pay | Admitting: Podiatry

## 2017-11-09 DIAGNOSIS — D492 Neoplasm of unspecified behavior of bone, soft tissue, and skin: Secondary | ICD-10-CM

## 2017-11-23 ENCOUNTER — Ambulatory Visit: Admission: RE | Admit: 2017-11-23 | Payer: Medicaid Other | Source: Ambulatory Visit

## 2017-12-07 ENCOUNTER — Ambulatory Visit
Admission: RE | Admit: 2017-12-07 | Discharge: 2017-12-07 | Disposition: A | Discharge status: Home / Self Care | Payer: Self-pay | Source: Ambulatory Visit | Attending: Podiatry | Admitting: Podiatry

## 2017-12-07 ENCOUNTER — Other Ambulatory Visit: Payer: Self-pay | Admitting: Podiatry

## 2017-12-07 DIAGNOSIS — D492 Neoplasm of unspecified behavior of bone, soft tissue, and skin: Secondary | ICD-10-CM | POA: Insufficient documentation

## 2017-12-07 DIAGNOSIS — M62571 Muscle wasting and atrophy, not elsewhere classified, right ankle and foot: Secondary | ICD-10-CM | POA: Insufficient documentation

## 2019-11-16 ENCOUNTER — Other Ambulatory Visit: Payer: Self-pay | Admitting: Family Medicine

## 2019-11-16 DIAGNOSIS — Z1231 Encounter for screening mammogram for malignant neoplasm of breast: Secondary | ICD-10-CM

## 2019-12-06 ENCOUNTER — Ambulatory Visit
Admission: RE | Admit: 2019-12-06 | Discharge: 2019-12-06 | Disposition: A | Discharge status: Home / Self Care | Payer: Medicare Other | Source: Ambulatory Visit | Attending: Family Medicine | Admitting: Family Medicine

## 2019-12-06 ENCOUNTER — Other Ambulatory Visit: Payer: Self-pay

## 2019-12-06 DIAGNOSIS — Z1231 Encounter for screening mammogram for malignant neoplasm of breast: Secondary | ICD-10-CM

## 2020-06-10 DIAGNOSIS — Z86718 Personal history of other venous thrombosis and embolism: Secondary | ICD-10-CM

## 2021-09-02 ENCOUNTER — Other Ambulatory Visit: Payer: Self-pay | Admitting: Family Medicine

## 2021-09-02 DIAGNOSIS — Z1231 Encounter for screening mammogram for malignant neoplasm of breast: Secondary | ICD-10-CM

## 2021-09-29 ENCOUNTER — Ambulatory Visit
Admission: RE | Admit: 2021-09-29 | Discharge: 2021-09-29 | Disposition: A | Discharge status: Home / Self Care | Payer: Medicare Other | Source: Ambulatory Visit | Attending: Family Medicine | Admitting: Family Medicine

## 2021-09-29 DIAGNOSIS — Z1231 Encounter for screening mammogram for malignant neoplasm of breast: Secondary | ICD-10-CM | POA: Insufficient documentation

## 2022-06-11 ENCOUNTER — Encounter: Payer: Self-pay | Admitting: Emergency Medicine

## 2022-06-11 ENCOUNTER — Emergency Department
Admission: EM | Admit: 2022-06-11 | Discharge: 2022-06-11 | Disposition: A | Discharge status: Home / Self Care | Payer: Medicare Other | Attending: Emergency Medicine | Admitting: Emergency Medicine

## 2022-06-11 ENCOUNTER — Other Ambulatory Visit: Payer: Self-pay

## 2022-06-11 DIAGNOSIS — M5441 Lumbago with sciatica, right side: Secondary | ICD-10-CM | POA: Insufficient documentation

## 2022-06-11 DIAGNOSIS — M545 Low back pain, unspecified: Secondary | ICD-10-CM | POA: Diagnosis present

## 2022-06-11 DIAGNOSIS — M5440 Lumbago with sciatica, unspecified side: Secondary | ICD-10-CM

## 2022-06-11 MED ORDER — OXYCODONE HCL 5 MG PO TABS
5.0000 mg | ORAL_TABLET | Freq: Four times a day (QID) | ORAL | 0 refills | Status: DC | PRN
Start: 2022-06-11 — End: 2023-01-18

## 2022-06-11 MED ORDER — METHOCARBAMOL 500 MG PO TABS: ORAL_TABLET | ORAL | 0 refills | Status: DC

## 2022-06-11 MED ORDER — ETODOLAC 400 MG PO TABS: 400.0000 mg | ORAL_TABLET | Freq: Two times a day (BID) | ORAL | 0 refills | Status: DC

## 2022-06-11 MED ORDER — METHOCARBAMOL 500 MG PO TABS
1000.0000 mg | ORAL_TABLET | Freq: Once | ORAL | Status: AC
  Administered 2022-06-11: 1000 mg via ORAL
  Filled 2022-06-11: qty 2

## 2022-06-11 MED ORDER — KETOROLAC TROMETHAMINE 30 MG/ML IJ SOLN
30.0000 mg | Freq: Once | INTRAMUSCULAR | Status: AC
  Administered 2022-06-11: 30 mg via INTRAMUSCULAR
  Filled 2022-06-11: qty 1

## 2022-06-11 MED ORDER — OXYCODONE-ACETAMINOPHEN 7.5-325 MG PO TABS
1.0000 | ORAL_TABLET | Freq: Once | ORAL | Status: AC
  Administered 2022-06-11: 1 via ORAL
  Filled 2022-06-11: qty 1

## 2022-06-11 NOTE — Discharge Instructions (Signed)
Follow-up with your primary care provider if any continued problems or concerns.  A prescription for etodolac, methocarbamol and oxycodone was sent to the pharmacy for you to take as needed.  Be aware that the oxycodone and methocarbamol will cause drowsiness and increase your risk for falling.  Do not drive or operate machinery while taking this medication.  You may also use ice or heat to your back as needed for discomfort.

## 2022-06-11 NOTE — ED Triage Notes (Signed)
Pt here via [facility] from home with right lower back pain x1 week. Pt has been having spasms, 800 mg of ibuprofen taken yesterday with no relief. Pt states pain radiates down her right leg. Pt has not taken her meds today.   160/99 87 97% RA

## 2022-06-11 NOTE — ED Provider Notes (Signed)
[facility] Provider Note    None    (approximate)   History   Back Pain   HPI  Alexandra Macias is a 62 y.o. female presents to the ED with complaint of right low back pain that started last week.  Patient states that this started after she was laid flat while doing an ultrasound of her thyroid and is continue to have muscle spasms since that time.  She has been taking ibuprofen without any relief of her pain.  She denies any urinary symptoms, incontinence of bowel or bladder.     Physical Exam   Triage Vital Signs: ED Triage Vitals  Enc Vitals Group     BP 06/11/22 0759 131/89     Pulse Rate 06/11/22 0758 86     Resp 06/11/22 0758 20     Temp 06/11/22 0758 98.8 F (37.1 C)     Temp Source 06/11/22 0758 Oral     SpO2 06/11/22 0758 93 %     Weight 06/11/22 0759 (!) 337 lb 15.4 oz (153.3 kg)     Height 06/11/22 0759 '5\' 7"'$  (1.702 m)     Head Circumference --      Peak Flow --      Pain Score 06/11/22 0759 10     Pain Loc --      Pain Edu? --      Excl. in [place]? --     Most recent vital signs: Vitals:   06/11/22 0759 06/11/22 0958  BP: 131/89 120/80  Pulse:  80  Resp:  18  Temp:    SpO2:  94%     General: Awake, no distress.  CV:  Good peripheral perfusion.  Resp:  Normal effort.  Lungs are clear bilaterally. Abd:  No distention.  Soft, nontender, bowel sounds normoactive x 4 quadrants. Other:  Right lower paravertebral muscles at L5-S1 and sacral area is moderately tender to palpation.  Patient's range of motion is restricted secondary to increased pain and guarding.  Good muscle strength at 5/5.  No tenderness on palpation of the lower extremities or over the right hip area.   ED Results / Procedures / Treatments   Labs (all labs ordered are listed, but only abnormal results are displayed) Labs Reviewed - No data to display    PROCEDURES:  Critical Care performed:   Procedures   MEDICATIONS ORDERED IN ED: Medications   methocarbamol (ROBAXIN) tablet 1,000 mg (1,000 mg Oral Given 06/11/22 0849)  oxyCODONE-acetaminophen (PERCOCET) 7.5-325 MG per tablet 1 tablet (1 tablet Oral Given 06/11/22 0849)  ketorolac (TORADOL) 30 MG/ML injection 30 mg (30 mg Intramuscular Given 06/11/22 0849)     IMPRESSION / MDM / ASSESSMENT AND PLAN / ED COURSE  I reviewed the triage vital signs and the nursing notes.   Differential diagnosis includes, but is not limited to, muscle skeletal strain, muscle skeletal pain, muscle spasms, low back pain with right leg sciatica.  62 year old female presents to the ED via EMS with complaint of right lower back pain for 1 week.  Patient has been taking ibuprofen without any relief and describes this as a muscle spasm restricting her range of motion.  Patient denies any recent injury and no urinary symptoms.  Patient was given Toradol 30 mg IM, methocarbamol 1000 mg p.o. and Percocet 7.5 mg while in the emergency department and improved greatly.  Prior to discharge patient states that she is having no pain and muscle spasms are controlled.  A  prescription for methocarbamol, etodolac 400 mg and oxycodone 5 mg #12 was sent to the pharmacy for patient to continue using and encouraged to use ice or heat to her back.  She is to follow-up with her PCP if any continued problems and return to the emergency department if any severe worsening of her symptoms.      Patient's presentation is most consistent with acute complicated illness / injury requiring diagnostic workup.  FINAL CLINICAL IMPRESSION(S) / ED DIAGNOSES   Final diagnoses:  Acute right-sided low back pain with sciatica, sciatica laterality unspecified     Rx / DC Orders   ED Discharge Orders          Ordered    methocarbamol (ROBAXIN) 500 MG tablet        06/11/22 0949    etodolac (LODINE) 400 MG tablet  2 times daily        06/11/22 0949    oxyCODONE (OXY IR/ROXICODONE) 5 MG immediate release tablet  Every 6 hours PRN         06/11/22 0949             Note:  This document was prepared using Dragon voice recognition software and may include unintentional dictation errors.   Johnn Hai, PA-C 06/11/22 1342    Lucillie Garfinkel, MD 06/11/22 1537

## 2022-06-11 NOTE — ED Notes (Signed)
See triage note  Presents with lower back pain  States she laid flat for an U/S about a week ago  Then when she got up she noticed pain   States pain is mainly to right lower back and moves into leg

## 2022-06-13 ENCOUNTER — Other Ambulatory Visit: Payer: Self-pay

## 2022-06-13 ENCOUNTER — Emergency Department
Admission: EM | Admit: 2022-06-13 | Discharge: 2022-06-13 | Disposition: A | Discharge status: Home / Self Care | Payer: Medicare Other | Attending: Emergency Medicine | Admitting: Emergency Medicine

## 2022-06-13 ENCOUNTER — Emergency Department: Payer: Medicare Other

## 2022-06-13 DIAGNOSIS — M5441 Lumbago with sciatica, right side: Secondary | ICD-10-CM | POA: Diagnosis not present

## 2022-06-13 DIAGNOSIS — M545 Low back pain, unspecified: Secondary | ICD-10-CM | POA: Diagnosis present

## 2022-06-13 MED ORDER — ONDANSETRON 4 MG PO TBDP
4.0000 mg | ORAL_TABLET | Freq: Once | ORAL | Status: AC
  Administered 2022-06-13: 4 mg via ORAL
  Filled 2022-06-13: qty 1

## 2022-06-13 MED ORDER — LORAZEPAM 1 MG PO TABS
1.0000 mg | ORAL_TABLET | Freq: Once | ORAL | Status: AC
  Administered 2022-06-13: 1 mg via ORAL
  Filled 2022-06-13: qty 1

## 2022-06-13 MED ORDER — DEXAMETHASONE SODIUM PHOSPHATE 10 MG/ML IJ SOLN
10.0000 mg | Freq: Once | INTRAMUSCULAR | Status: AC
  Administered 2022-06-13: 10 mg via INTRAMUSCULAR
  Filled 2022-06-13: qty 1

## 2022-06-13 MED ORDER — METHYLPREDNISOLONE 4 MG PO TBPK: ORAL_TABLET | ORAL | 0 refills | Status: DC

## 2022-06-13 MED ORDER — OXYCODONE-ACETAMINOPHEN 5-325 MG PO TABS
1.0000 | ORAL_TABLET | Freq: Once | ORAL | Status: AC
  Administered 2022-06-13: 1 via ORAL
  Filled 2022-06-13: qty 1

## 2022-06-13 NOTE — ED Notes (Signed)
ED Provider at bedside. 

## 2022-06-13 NOTE — ED Provider Notes (Signed)
[facility] Provider Note  Patient Contact: 5:01 PM (approximate)   History   Leg Pain   HPI  Alexandra Macias is a 62 y.o. female presents to the emergency department with persistent and worsening right-sided low back pain that radiates into the right aspect of the buttocks and does cause patient has some tingling in her toes.  Patient reports that she has been able to stand and can ambulate easily.  She has been taking medications prescribed to her on 3 7 as directed with some improvement in her pain transiently.  No bowel or bladder incontinence or saddle anesthesia.      Physical Exam   Triage Vital Signs: ED Triage Vitals  Enc Vitals Group     BP 06/13/22 1627 (!) 152/86     Pulse Rate 06/13/22 1627 92     Resp 06/13/22 1627 18     Temp 06/13/22 1627 98.7 F (37.1 C)     Temp Source 06/13/22 1627 Oral     SpO2 06/13/22 1627 97 %     Weight 06/13/22 1623 (!) 385 lb (174.6 kg)     Height 06/13/22 1623 5\' 7"  (1.702 m)     Head Circumference --      Peak Flow --      Pain Score 06/13/22 1623 10     Pain Loc --      Pain Edu? --      Excl. in [place]? --     Most recent vital signs: Vitals:   06/13/22 1627  BP: (!) 152/86  Pulse: 92  Resp: 18  Temp: 98.7 F (37.1 C)  SpO2: 97%     General: Alert and in no acute distress. Eyes:  PERRL. EOMI. Head: No acute traumatic findings ENT:      Nose: No congestion/rhinnorhea.      Mouth/Throat: Mucous membranes are moist. Neck: No stridor. No cervical spine tenderness to palpation. Cardiovascular:  Good peripheral perfusion Respiratory: Normal respiratory effort without tachypnea or retractions. Lungs CTAB. Good air entry to the bases with no decreased or absent breath sounds. Gastrointestinal: Bowel sounds 4 quadrants. Soft and nontender to palpation. No guarding or rigidity. No palpable masses. No distention. No CVA tenderness. Musculoskeletal: Full range of motion to all extremities. Patient has  paraspinal muscle tenderness along the lumbar spine.  Patient has no right great toe weakness.  Patient can plantarflex easily.  Patient can stand and can ambulate. Neurologic:  No gross focal neurologic deficits are appreciated.  Skin:   No rash noted    ED Results / Procedures / Treatments   Labs (all labs ordered are listed, but only abnormal results are displayed) Labs Reviewed - No data to display      RADIOLOGY  I personally viewed and evaluated these images as part of my medical decision making, as well as reviewing the written report by the radiologist.  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  Patient has effacement of the lateral recesses at this level likely compressing the descending L4 nerve root    PROCEDURES:  Critical Care performed: No  Procedures   MEDICATIONS ORDERED IN ED: Medications  oxyCODONE-acetaminophen (PERCOCET/ROXICET) 5-325 MG per tablet 1 tablet (1 tablet Oral Given 06/13/22 1647)  ondansetron (ZOFRAN-ODT) disintegrating tablet 4 mg (4 mg Oral Given 06/13/22 1648)  LORazepam (ATIVAN) tablet 1 mg (1 mg Oral Given 06/13/22 1734)  dexamethasone (DECADRON) injection  10 mg (10 mg Intramuscular Given 06/13/22 1942)     IMPRESSION / MDM / ASSESSMENT AND PLAN / ED COURSE  I reviewed the triage vital signs and the nursing notes.                              Assessment and plan:  Low back pain:  62 year old female presents to the emergency department with low back pain that radiated down the posterior aspect of the right lower extremity.  Patient was hypertensive at triage but vital signs otherwise reassuring.  On exam, patient alert, active and nontoxic-appearing.  Patient was able to stand and could walk but had severe pain.  She could plantarflex easily and no weakness of the right great toe.  She did have a positive straight leg raise test on the  right.  Her sensation was intact.  MRI of the lumbar spine indicated L3-L4 moderate to severe spinal canal stenosis without spinal cord impingement and compression of L4 nerve root.  Patient was given injection of Decadron in the emergency department and was given a Percocet for pain.  I started patient on a Medrol Dosepak and will refer her to neurosurgery to be seen as an outpatient.  Return precautions were given to return with new or worsening symptoms.  All patient questions were answered.  FINAL CLINICAL IMPRESSION(S) / ED DIAGNOSES   Final diagnoses:  Acute right-sided low back pain with right-sided sciatica     Rx / DC Orders   ED Discharge Orders          Ordered    methylPREDNISolone (MEDROL DOSEPAK) 4 MG TBPK tablet        06/13/22 1935             Note:  This document was prepared using Dragon voice recognition software and may include unintentional dictation errors.   Pia Mau Scofield, PA-C 06/13/22 1944    Minna Antis, MD 06/17/22 1357

## 2022-06-13 NOTE — Discharge Instructions (Signed)
Take Medrol Dosepak as directed. 

## 2022-06-13 NOTE — ED Triage Notes (Addendum)
Pt brought to ED from Home via [facility] for right sided leg pain x1 week. Pt states it starts at the top of right buttocks and travels down to foot. Pt states when sitting foot will start tingling then when goes to stand sharp pain through right leg.   EMS vitals: 143/86 97% RA 97p

## 2022-06-15 NOTE — Progress Notes (Addendum)
Referring Physician:  Donnamarie Rossetti, PA-C [address],  [place] [id number]  Primary Physician:  Donnamarie Rossetti, PA-C  History of Present Illness: 06/18/2022 Ms. Alexandra Macias has a history of HTN, asthma, hyperlipidemia, DM, obesity, DVT.   Seen in ED on 06/11/22 and 06/13/22 with right sided back and leg pain.   Pain started after getting up from laying flat for [place] in February. She has constant right sided LBP with constant right lateral leg pain into her toes. No aggravating/alleviating factors. She has numbness and tingling in her right leg.   Given Lodine, robaxin, and oxycodone on 06/11/22. Given prednisone on 06/13/22.   Bowel/Bladder Dysfunction: none  Conservative measures:  Physical therapy: none Multimodal medical therapy including regular antiinflammatories: lodine, robaxin, prednisone, oxycodone, percocet, ibuprofen,  Injections: None  Past Surgery: None  Alexandra Macias has no symptoms of cervical myelopathy.  The symptoms are causing a significant impact on the patient's life.   Review of Systems:  A 10 point review of systems is negative, except for the pertinent positives and negatives detailed in the HPI.  Past Medical History: Past Medical History:  Diagnosis Date   DVT (deep venous thrombosis) ([place]) 2005   post op hysterectomy   Hypertension    Medical history non-contributory     Past Surgical History: Past Surgical History:  Procedure Laterality Date   ABDOMINAL HYSTERECTOMY  2005   FOOT FUSION  2015   right-fx   HARDWARE REMOVAL Right 07/05/2014   Procedure: REMOVAL OF DEEP IMPLANTS OF RIGHT ANKLE;  Surgeon: Wylene Simmer, MD;  Location: [facility];  Service: Orthopedics;  Laterality: Right;   ORIF FOOT FRACTURE  2010   left   ORIF WRIST FRACTURE     both rt and lt    Allergies: Allergies as of 06/18/2022   (No Known Allergies)    Medications: Outpatient Encounter Medications as of 06/18/2022   Medication Sig   albuterol (PROVENTIL HFA;VENTOLIN HFA) 108 (90 Base) MCG/ACT inhaler Inhale 2 puffs into the lungs every 4 (four) hours as needed for wheezing or shortness of breath.   amLODipine (NORVASC) 10 MG tablet Take 10 mg by mouth daily.   etodolac (LODINE) 400 MG tablet Take 1 tablet (400 mg total) by mouth 2 (two) times daily.   lisinopril (ZESTRIL) 5 MG tablet Take 5 mg by mouth daily.   methocarbamol (ROBAXIN) 500 MG tablet 1-2 tablets every 6 hours prn muscle spasms   methylPREDNISolone (MEDROL DOSEPAK) 4 MG TBPK tablet 6,5,4,3,2,1   oxyCODONE (OXY IR/ROXICODONE) 5 MG immediate release tablet Take 1 tablet (5 mg total) by mouth every 6 (six) hours as needed for severe pain.   No facility-administered encounter medications on file as of 06/18/2022.    Social History: Social History   Tobacco Use   Smoking status: Former    Types: Cigarettes    Quit date: 12/26/2013    Years since quitting: 8.4   Smokeless tobacco: Never  Substance Use Topics   Alcohol use: Yes    Comment: rare   Drug use: No    Family Medical History: Family History  Problem Relation Age of Onset   Breast cancer Neg Hx     Physical Examination: Vitals:   06/18/22 1346  BP: 126/80    General: Patient is well developed, well nourished, calm, collected, and in no apparent distress. Attention to examination is appropriate.  Respiratory: Patient is breathing without any difficulty.   NEUROLOGICAL:  Awake, alert, oriented to person, place, and time.  Speech is clear and fluent. Fund of knowledge is appropriate.   Cranial Nerves: Pupils equal round and reactive to light.  Facial tone is symmetric.    Limited ROM of lumbar spine with pain on flexion diffuse posterior lumbar tenderness.   No abnormal lesions on exposed skin.   Strength: Side Biceps Triceps Deltoid Interossei Grip Wrist Ext. Wrist Flex.  R '5 5 5 5 5 5 5  '$ L '5 5 5 5 5 5 5   '$ Side Iliopsoas Quads Hamstring PF DF EHL  R '5  5 5 5 5 5  '$ L '5 5 5 5 5 5   '$ Reflexes are 1+ and symmetric at the biceps, triceps, brachioradialis, patella and achilles.   Hoffman's is absent.  Clonus is not present.   Bilateral upper and lower extremity sensation is intact to light touch, but diminished left knee down to foot.   Gait is slow and she limps favoring right leg.   Medical Decision Making  Imaging: MRI of lumbar spine dated 06/13/22:  FINDINGS: Segmentation: Transitional anatomy with partial sacralization of L5.   Alignment:  No listhesis.  Mild levocurvature.   Vertebrae: No acute fracture, suspicious osseous lesion, or evidence of discitis.   Conus medullaris and cauda equina: Conus extends to the L1 level. Conus and cauda equina appear normal.   Paraspinal and other soft tissues: Renal cysts, for which no follow-up is currently indicated. No lymphadenopathy.   Disc levels:   T12-L1: No significant disc bulge. No spinal canal stenosis or neural foraminal narrowing.   L1-L2: No significant disc bulge. No spinal canal stenosis or neural foraminal narrowing.   L2-L3: Minimal disc bulge. Mild facet arthropathy. Mild spinal canal stenosis. No neural foraminal narrowing.   L3-L4: Mild disc bulge. Moderate facet arthropathy. Effacement of the lateral recesses. Moderate to severe spinal canal stenosis. Mild right and moderate left neural foraminal narrowing.   L4-L5: Mild disc bulge with right foraminal disc extrusion with cranial migration, which likely contacts the exiting right L4 nerve. Mild facet arthropathy. Narrowing of the lateral recesses. Moderate spinal canal stenosis. Severe right and mild-to-moderate left neural foraminal narrowing.   L5-S1: No significant disc bulge. No spinal canal stenosis or neural foraminal narrowing.   IMPRESSION: 1. L4-L5 moderate spinal canal stenosis with severe right and mild-to-moderate left neural foraminal narrowing. A right foraminal disc extrusion likely contacts  the exiting right L4 nerve. Narrowing of the lateral recesses at this level could affect the descending L5 nerve roots. 2. L3-L4 moderate to severe spinal canal stenosis with moderate left and mild right neural foraminal narrowing. Effacement of the lateral recesses at this level likely compresses the descending L4 nerve roots. 3. L2-L3 mild spinal canal stenosis. 4. 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 Please correlate with imaging if any intervention is planned.     Electronically Signed   By: Merilyn Baba M.D.   On: 06/13/2022 19:15  CT of lumbar spine dated 06/11/22:  FINDINGS: Segmentation: Transitional lumbosacral anatomy with partial sacralization of the L5 vertebral segment.   Alignment: Normal.   Vertebrae: Normal vertebral body heights.  No evidence of fracture.   Paraspinal and other soft tissues: 3.6 cm left adrenal adenoma.   Disc levels:   T12-L1:  Normal.   L1-L2:  Normal.   L2-L3: Disc bulge and facet arthropathy results in mild spinal canal stenosis and mild bilateral neural foraminal narrowing.   L3-L4: Disc bulge and facet arthropathy  results in moderate to severe spinal canal stenosis and moderate bilateral neural foraminal narrowing.   L4-L5: Disc bulge and facet arthropathy contribute to moderate to severe spinal canal stenosis and moderate left neural foraminal narrowing. Extruded, gas containing disc fragment in the right neural foramen results in severe right neural foraminal narrowing.   L5-S1:  Normal.   IMPRESSION: 1. No acute fracture or traumatic malalignment of the lumbar spine. 2. 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 3. Multilevel degenerative disc disease and facet arthropathy contribute to moderate to severe spinal canal stenosis at L3-L4 and L4-L5. 4. Extruded, gas containing disc fragment in the right L4-L5 neural foramen results in severe right neural  foraminal narrowing. 5. 3.6 cm left adrenal adenoma.     Electronically Signed   By: Emmit Alexanders M.D.   On: 06/13/2022 17:17    I have personally reviewed the images and agree with the above interpretation.  Assessment and Plan: Ms. Gerrits is a pleasant 62 y.o. female has 6 week history of constant right sided LBP with constant right lateral leg pain into her toes. She has numbness and tingling in her right leg. No weakness.   She has known right sided disc at L4-L5 with moderate central stenosis and lateral recess stenosis on right. This is likely her primary pain generator. She also has known multilevel lumbar spondylosis with moderate to severe spinal stenosis at JL3-l4 and mild spinal stenosis at L2-L3.   Treatment options discussed with patient and following plan made:   - Order for physical therapy for lumbar spine to [facility].  - Prescription for neurontin. Reviewed dosing and side effects. Given instructions to get up to tid. Call if any side effects.   - Referral to PMR at [facility] to discuss possible lumbar injections.  - Weight loss discussed and encouraged. Goal weight is 255 pounds.  - She will follow up with me in 6-8 weeks for recheck.   I spent a total of 35 minutes in face-to-face and non-face-to-face activities related to this patient's care today including review of outside records, review of imaging, review of symptoms, physical exam, discussion of differential diagnosis, discussion of treatment options, and documentation.   Thank you for involving me in the care of this patient.   ADDENDUM 06/22/22:  She has an established relationship with the [facility], so I initially sent her there for injections. She will not be able to injections done there due to weight limit of table.   New referral put in for evaluation of lumbar injections with pain management. Patient notified.   Geronimo Boot PA-C Dept. of Neurosurgery

## 2022-06-18 ENCOUNTER — Encounter: Payer: Self-pay | Admitting: Orthopedic Surgery

## 2022-06-18 ENCOUNTER — Ambulatory Visit (INDEPENDENT_AMBULATORY_CARE_PROVIDER_SITE_OTHER): Payer: Medicare Other | Admitting: Orthopedic Surgery

## 2022-06-18 VITALS — BP 126/80 | Ht 67.0 in | Wt 385.0 lb

## 2022-06-18 DIAGNOSIS — M5126 Other intervertebral disc displacement, lumbar region: Secondary | ICD-10-CM

## 2022-06-18 DIAGNOSIS — M48061 Spinal stenosis, lumbar region without neurogenic claudication: Secondary | ICD-10-CM

## 2022-06-18 DIAGNOSIS — M5416 Radiculopathy, lumbar region: Secondary | ICD-10-CM | POA: Diagnosis not present

## 2022-06-18 MED ORDER — GABAPENTIN 300 MG PO CAPS: ORAL_CAPSULE | ORAL | 2 refills | Status: DC

## 2022-06-18 NOTE — Patient Instructions (Signed)
It was so nice to see you today. Thank you so much for coming in.    You have some wear and tear in your back. I think most of your pain is from a disc herniation on the right at L4-L5.   I sent physical therapy orders to [facility] on [facility]. You can call them at [phone] if you don't hear from them to schedule your visit.   I sent a prescription for gabapentin to help with nerve pain. Take as directed. Side effects include feeling sleepy, dizzy, or drunk. Call me with any concerns.   I want you to see physical medicine and rehab at the [facility] to discuss possible injections in your lower back. Dr. Sharlet Salina, Dr. Alba Destine, and their PA Loree Fee are great and will take good care of you. They should call you to schedule an appointment or you can call them at [phone].   Continue to work on weight loss. Would like you go get down to around 255 pounds prior to any surgery.   I will see you back in 6-8 weeks. Please do not hesitate to call if you have any questions or concerns. You can also message me in [place].   Geronimo Boot PA-C [phone]

## 2022-06-22 NOTE — Addendum Note (Signed)
Addended byGeronimo Boot on: 06/22/2022 04:08 PM   Modules accepted: Orders

## 2022-06-25 ENCOUNTER — Ambulatory Visit: Payer: Medicare Other | Admitting: Orthopedic Surgery

## 2022-08-03 ENCOUNTER — Ambulatory Visit: Payer: Medicare Other | Admitting: Orthopedic Surgery

## 2022-11-18 ENCOUNTER — Other Ambulatory Visit: Payer: Self-pay | Admitting: Family Medicine

## 2022-11-18 DIAGNOSIS — Z1231 Encounter for screening mammogram for malignant neoplasm of breast: Secondary | ICD-10-CM

## 2022-12-22 ENCOUNTER — Other Ambulatory Visit: Payer: Self-pay

## 2022-12-22 ENCOUNTER — Encounter: Payer: Self-pay | Admitting: Emergency Medicine

## 2022-12-22 ENCOUNTER — Emergency Department: Payer: Medicare Other

## 2022-12-22 ENCOUNTER — Emergency Department
Admission: EM | Admit: 2022-12-22 | Discharge: 2022-12-22 | Disposition: A | Discharge status: Home / Self Care | Payer: Medicare Other | Attending: Emergency Medicine | Admitting: Emergency Medicine

## 2022-12-22 DIAGNOSIS — I10 Essential (primary) hypertension: Secondary | ICD-10-CM | POA: Diagnosis not present

## 2022-12-22 DIAGNOSIS — M79604 Pain in right leg: Secondary | ICD-10-CM | POA: Diagnosis present

## 2022-12-22 LAB — CBC
HCT: 42.5 % (ref 36.0–46.0)
Hemoglobin: 13.4 g/dL (ref 12.0–15.0)
MCH: 25.3 pg — ABNORMAL LOW (ref 26.0–34.0)
MCHC: 31.5 g/dL (ref 30.0–36.0)
MCV: 80.3 fL (ref 80.0–100.0)
Platelets: 282 10*3/uL (ref 150–400)
RBC: 5.29 MIL/uL — ABNORMAL HIGH (ref 3.87–5.11)
RDW: 14.2 % (ref 11.5–15.5)
WBC: 8 10*3/uL (ref 4.0–10.5)
nRBC: 0 % (ref 0.0–0.2)

## 2022-12-22 LAB — BASIC METABOLIC PANEL
Anion gap: 12 (ref 5–15)
BUN: 25 mg/dL — ABNORMAL HIGH (ref 8–23)
CO2: 23 mmol/L (ref 22–32)
Calcium: 9.1 mg/dL (ref 8.9–10.3)
Chloride: 101 mmol/L (ref 98–111)
Creatinine, Ser: 0.99 mg/dL (ref 0.44–1.00)
GFR, Estimated: >60 mL/min (ref >60)
Glucose, Bld: 109 mg/dL — ABNORMAL HIGH (ref 70–99)
Potassium: 4.1 mmol/L (ref 3.5–5.1)
Sodium: 136 mmol/L (ref 135–145)

## 2022-12-22 MED ORDER — OXYCODONE-ACETAMINOPHEN 5-325 MG PO TABS
1.0000 | ORAL_TABLET | Freq: Once | ORAL | Status: AC
  Administered 2022-12-22: 1 via ORAL
  Filled 2022-12-22: qty 1

## 2022-12-22 MED ORDER — HYDROCODONE-ACETAMINOPHEN 5-325 MG PO TABS: 1.0000 | ORAL_TABLET | ORAL | 0 refills | Status: DC | PRN

## 2022-12-22 NOTE — ED Notes (Signed)
Patient verbalizes understanding of discharge instructions. Opportunity for questioning and answers were provided. Armband removed by staff, pt discharged from ED. Wheeled out to lobby, left with visitor

## 2022-12-22 NOTE — ED Triage Notes (Signed)
Pt in with R leg pain and swelling that began today. Pain described as numb and tingling, sharp pain that begins at R knee and calf. Denies any sob or cp, hx of DVT 107yrs ago, takes no thinners and had IVC filter taken out years ago.

## 2022-12-22 NOTE — ED Provider Notes (Signed)
[facility] Provider Note    Event Date/Time   First MD Initiated Contact with Patient 12/22/22 2208     (approximate)  History   Chief Complaint: Leg Pain  HPI  Alexandra Macias is a 62 y.o. female with a past medical history of prior DVT in 2005, hypertension, presents to the emergency department for right leg pain.  According to the patient for the last 2 days she has been experiencing pain as well as some mild swelling in the right leg.  States most the pain is behind the right knee and on the calf.  No fever.  No chest pain no shortness of breath no pleuritic pain.  Patient states back in 2005 she had a DVT she was placed on anticoagulation and had an IVC filter, the filter since been removed and the patient is no longer on anticoagulation.  Patient states the pain she is experiencing in the leg is reminiscent of the discomfort she had when she had a blood clot.  Physical Exam   Triage Vital Signs: ED Triage Vitals  Encounter Vitals Group     BP 12/22/22 2018 (!) 145/74     Systolic BP Percentile --      Diastolic BP Percentile --      Pulse Rate 12/22/22 2018 86     Resp 12/22/22 2018 20     Temp 12/22/22 2018 98.4 F (36.9 C)     Temp Source 12/22/22 2018 Oral     SpO2 12/22/22 2018 95 %     Weight 12/22/22 2019 (!) 395 lb (179.2 kg)     Height --      Head Circumference --      Peak Flow --      Pain Score 12/22/22 2019 10     Pain Loc --      Pain Education --      Exclude from Growth Chart --     Most recent vital signs: Vitals:   12/22/22 2018  BP: (!) 145/74  Pulse: 86  Resp: 20  Temp: 98.4 F (36.9 C)  SpO2: 95%    General: Awake, no distress.  CV:  Good peripheral perfusion.  Regular rate and rhythm  Resp:  Normal effort.  Equal breath sounds bilaterally.  Abd:  No distention.  Soft, nontender.  No rebound or guarding. Other:  Mild right leg tenderness to palpation.  No appreciable swelling on my examination.   ED Results /  Procedures / Treatments   EKG  EKG viewed and interpreted by myself shows a normal sinus rhythm 84 bpm with a narrow QRS, normal axis, normal intervals, no concerning ST changes.  RADIOLOGY  negative for VTE.   MEDICATIONS ORDERED IN ED: Medications - No data to display   IMPRESSION / MDM / ASSESSMENT AND PLAN / ED COURSE  I reviewed the triage vital signs and the nursing notes.  Patient's presentation is most consistent with acute presentation with potential threat to life or bodily function.  Patient presents to the emergency department for right leg pain.  Patient has a history of a DVT nearly 20 years ago.  Overall patient appears well, does have mild tenderness to palpation in the popliteal fossa as well as the calf.  No erythema no appreciable swelling.  Patient CBC is reassuring with a normal white blood cell count, chemistry is reassuring.  Will obtain an ultrasound to rule out DVT we will dose a pain pill to help with the  discomfort.  Patient's ultrasound has resulted negative for DVT.  Small Baker's cyst.  We will discharge with PCP follow-up.  Patient agreeable to plan of care.  FINAL CLINICAL IMPRESSION(S) / ED DIAGNOSES   Right leg pain   Note:  This document was prepared using Dragon voice recognition software and may include unintentional dictation errors.   Minna Antis, MD 12/22/22 2318

## 2022-12-24 ENCOUNTER — Emergency Department
Admission: EM | Admit: 2022-12-24 | Discharge: 2022-12-24 | Disposition: A | Discharge status: Home / Self Care | Payer: Medicare Other | Attending: Emergency Medicine | Admitting: Emergency Medicine

## 2022-12-24 ENCOUNTER — Other Ambulatory Visit: Payer: Self-pay

## 2022-12-24 ENCOUNTER — Emergency Department: Payer: Medicare Other

## 2022-12-24 DIAGNOSIS — R1030 Lower abdominal pain, unspecified: Secondary | ICD-10-CM | POA: Insufficient documentation

## 2022-12-24 DIAGNOSIS — K529 Noninfective gastroenteritis and colitis, unspecified: Secondary | ICD-10-CM

## 2022-12-24 LAB — COMPREHENSIVE METABOLIC PANEL
ALT: 13 U/L (ref 0–44)
AST: 19 U/L (ref 15–41)
Albumin: 3.7 g/dL (ref 3.5–5.0)
Alkaline Phosphatase: 71 U/L (ref 38–126)
Anion gap: 10 (ref 5–15)
BUN: 16 mg/dL (ref 8–23)
CO2: 22 mmol/L (ref 22–32)
Calcium: 8.7 mg/dL — ABNORMAL LOW (ref 8.9–10.3)
Chloride: 103 mmol/L (ref 98–111)
Creatinine, Ser: 0.6 mg/dL (ref 0.44–1.00)
GFR, Estimated: >60 mL/min (ref >60)
Glucose, Bld: 128 mg/dL — ABNORMAL HIGH (ref 70–99)
Potassium: 3.6 mmol/L (ref 3.5–5.1)
Sodium: 135 mmol/L (ref 135–145)
Total Bilirubin: 1.1 mg/dL (ref 0.3–1.2)
Total Protein: 7.2 g/dL (ref 6.5–8.1)

## 2022-12-24 LAB — CBC
HCT: 45.9 % (ref 36.0–46.0)
Hemoglobin: 14.5 g/dL (ref 12.0–15.0)
MCH: 24.9 pg — ABNORMAL LOW (ref 26.0–34.0)
MCHC: 31.6 g/dL (ref 30.0–36.0)
MCV: 78.9 fL — ABNORMAL LOW (ref 80.0–100.0)
Platelets: 311 10*3/uL (ref 150–400)
RBC: 5.82 MIL/uL — ABNORMAL HIGH (ref 3.87–5.11)
RDW: 14.2 % (ref 11.5–15.5)
WBC: 10.7 10*3/uL — ABNORMAL HIGH (ref 4.0–10.5)
nRBC: 0 % (ref 0.0–0.2)

## 2022-12-24 LAB — LIPASE, BLOOD: Lipase: 21 U/L (ref 11–51)

## 2022-12-24 MED ORDER — TRAMADOL HCL 50 MG PO TABS: 50.0000 mg | ORAL_TABLET | Freq: Four times a day (QID) | ORAL | 0 refills | Status: DC | PRN

## 2022-12-24 MED ORDER — IOHEXOL 350 MG/ML SOLN
100.0000 mL | Freq: Once | INTRAVENOUS | Status: AC | PRN
  Administered 2022-12-24: 100 mL via INTRAVENOUS

## 2022-12-24 MED ORDER — ONDANSETRON HCL 4 MG/2ML IJ SOLN
4.0000 mg | Freq: Once | INTRAMUSCULAR | Status: AC
  Administered 2022-12-24: 4 mg via INTRAVENOUS
  Filled 2022-12-24: qty 2

## 2022-12-24 MED ORDER — MORPHINE SULFATE (PF) 4 MG/ML IV SOLN
4.0000 mg | Freq: Once | INTRAVENOUS | Status: AC
  Administered 2022-12-24: 4 mg via INTRAVENOUS
  Filled 2022-12-24: qty 1

## 2022-12-24 MED ORDER — AMOXICILLIN-POT CLAVULANATE 875-125 MG PO TABS: 1.0000 | ORAL_TABLET | Freq: Two times a day (BID) | ORAL | 0 refills | Status: AC

## 2022-12-24 NOTE — ED Provider Notes (Signed)
[facility] Provider Note    Event Date/Time   First MD Initiated Contact with Patient 12/24/22 1301     (approximate)   History   Abdominal Pain   HPI  Alexandra Macias is a 62 y.o. female with a history of a hysterectomy who presents with complaints of lower abdominal pain.  Patient reports pain started yesterday evening and has progressed throughout the night, she describes it as contraction-like pain.  Denies dysuria.  No fevers or chills.  No flank pain.  Normal stools.  No history of diverticulitis reported     Physical Exam   Triage Vital Signs: ED Triage Vitals  Encounter Vitals Group     BP 12/24/22 1219 121/81     Systolic BP Percentile --      Diastolic BP Percentile --      Pulse Rate 12/24/22 1219 86     Resp 12/24/22 1219 20     Temp 12/24/22 1219 98.6 F (37 C)     Temp src --      SpO2 12/24/22 1219 96 %     Weight 12/24/22 1219 (!) 174.6 kg (385 lb)     Height 12/24/22 1219 1.702 m (5\' 7" )     Head Circumference --      Peak Flow --      Pain Score 12/24/22 1213 10     Pain Loc --      Pain Education --      Exclude from Growth Chart --     Most recent vital signs: Vitals:   12/24/22 1219  BP: 121/81  Pulse: 86  Resp: 20  Temp: 98.6 F (37 C)  SpO2: 96%     General: Awake, no distress.  CV:  Good peripheral perfusion.  Resp:  Normal effort.  Abd:  No distention.  Tenderness to palpation in the left lower quadrant, greater than right lower quadrant, no CVA tenderness Other:     ED Results / Procedures / Treatments   Labs (all labs ordered are listed, but only abnormal results are displayed) Labs Reviewed  COMPREHENSIVE METABOLIC PANEL - Abnormal; Notable for the following components:      Result Value   Glucose, Bld 128 (*)    Calcium 8.7 (*)    All other components within normal limits  CBC - Abnormal; Notable for the following components:   WBC 10.7 (*)    RBC 5.82 (*)    MCV 78.9 (*)    MCH 24.9 (*)     All other components within normal limits  LIPASE, BLOOD  URINALYSIS, ROUTINE W REFLEX MICROSCOPIC     EKG     RADIOLOGY CT abdomen pelvis    PROCEDURES:  Critical Care performed:   Procedures   MEDICATIONS ORDERED IN ED: Medications  morphine (PF) 4 MG/ML injection 4 mg (has no administration in time range)  ondansetron (ZOFRAN) injection 4 mg (has no administration in time range)     IMPRESSION / MDM / ASSESSMENT AND PLAN / ED COURSE  I reviewed the triage vital signs and the nursing notes. Patient's presentation is most consistent with acute presentation with potential threat to life or bodily function.  Patient presents with abdominal pain as detailed above, tenderness in the lower abdomen as detailed above.  Differential includes diverticulitis, colitis, UTI, ureterolithiasis  Will treat with IV morphine, IV Zofran, obtain CT abdomen pelvis  Lab work reviewed, mild elevation of white blood cell count.  FINAL CLINICAL IMPRESSION(S) / ED DIAGNOSES   Final diagnoses:  None     Rx / DC Orders   ED Discharge Orders     None        Note:  This document was prepared using Dragon voice recognition software and may include unintentional dictation errors.   Jene Every, MD 12/24/22 1346

## 2022-12-24 NOTE — ED Triage Notes (Signed)
Pt comes via [facility] from home with belly pain below bellybutton that started today and has gotten more like contraction now. Pt states nausea and vomiting. VSS

## 2023-01-12 ENCOUNTER — Emergency Department: Payer: Medicare Other

## 2023-01-12 ENCOUNTER — Inpatient Hospital Stay
Admission: EM | Admit: 2023-01-12 | Discharge: 2023-01-18 | DRG: 872 | Disposition: A | Discharge status: Home / Self Care | Payer: Medicare Other | Attending: Hospitalist | Admitting: Hospitalist

## 2023-01-12 ENCOUNTER — Other Ambulatory Visit: Payer: Self-pay

## 2023-01-12 DIAGNOSIS — Z79899 Other long term (current) drug therapy: Secondary | ICD-10-CM

## 2023-01-12 DIAGNOSIS — Z86718 Personal history of other venous thrombosis and embolism: Secondary | ICD-10-CM

## 2023-01-12 DIAGNOSIS — K5730 Diverticulosis of large intestine without perforation or abscess without bleeding: Secondary | ICD-10-CM | POA: Diagnosis present

## 2023-01-12 DIAGNOSIS — I10 Essential (primary) hypertension: Secondary | ICD-10-CM | POA: Diagnosis present

## 2023-01-12 DIAGNOSIS — M48061 Spinal stenosis, lumbar region without neurogenic claudication: Secondary | ICD-10-CM

## 2023-01-12 DIAGNOSIS — R5382 Chronic fatigue, unspecified: Secondary | ICD-10-CM | POA: Diagnosis present

## 2023-01-12 DIAGNOSIS — M5126 Other intervertebral disc displacement, lumbar region: Secondary | ICD-10-CM

## 2023-01-12 DIAGNOSIS — Z6841 Body Mass Index (BMI) 40.0 and over, adult: Secondary | ICD-10-CM | POA: Diagnosis not present

## 2023-01-12 DIAGNOSIS — R300 Dysuria: Secondary | ICD-10-CM | POA: Diagnosis present

## 2023-01-12 DIAGNOSIS — K529 Noninfective gastroenteritis and colitis, unspecified: Secondary | ICD-10-CM | POA: Diagnosis present

## 2023-01-12 DIAGNOSIS — Z7182 Exercise counseling: Secondary | ICD-10-CM

## 2023-01-12 DIAGNOSIS — R3915 Urgency of urination: Secondary | ICD-10-CM | POA: Diagnosis present

## 2023-01-12 DIAGNOSIS — Z90710 Acquired absence of both cervix and uterus: Secondary | ICD-10-CM

## 2023-01-12 DIAGNOSIS — E785 Hyperlipidemia, unspecified: Secondary | ICD-10-CM | POA: Diagnosis present

## 2023-01-12 DIAGNOSIS — Z1624 Resistance to multiple antibiotics: Secondary | ICD-10-CM | POA: Diagnosis present

## 2023-01-12 DIAGNOSIS — E876 Hypokalemia: Secondary | ICD-10-CM | POA: Diagnosis not present

## 2023-01-12 DIAGNOSIS — A4150 Gram-negative sepsis, unspecified: Principal | ICD-10-CM | POA: Diagnosis present

## 2023-01-12 DIAGNOSIS — E871 Hypo-osmolality and hyponatremia: Secondary | ICD-10-CM | POA: Diagnosis not present

## 2023-01-12 DIAGNOSIS — M5416 Radiculopathy, lumbar region: Secondary | ICD-10-CM

## 2023-01-12 DIAGNOSIS — N12 Tubulo-interstitial nephritis, not specified as acute or chronic: Secondary | ICD-10-CM | POA: Diagnosis present

## 2023-01-12 DIAGNOSIS — A419 Sepsis, unspecified organism: Principal | ICD-10-CM

## 2023-01-12 DIAGNOSIS — Z7984 Long term (current) use of oral hypoglycemic drugs: Secondary | ICD-10-CM

## 2023-01-12 DIAGNOSIS — A499 Bacterial infection, unspecified: Secondary | ICD-10-CM

## 2023-01-12 DIAGNOSIS — Z1152 Encounter for screening for COVID-19: Secondary | ICD-10-CM | POA: Diagnosis not present

## 2023-01-12 DIAGNOSIS — E1142 Type 2 diabetes mellitus with diabetic polyneuropathy: Secondary | ICD-10-CM

## 2023-01-12 DIAGNOSIS — R5381 Other malaise: Secondary | ICD-10-CM | POA: Diagnosis present

## 2023-01-12 DIAGNOSIS — Z713 Dietary counseling and surveillance: Secondary | ICD-10-CM

## 2023-01-12 DIAGNOSIS — E279 Disorder of adrenal gland, unspecified: Secondary | ICD-10-CM | POA: Diagnosis present

## 2023-01-12 DIAGNOSIS — R350 Frequency of micturition: Secondary | ICD-10-CM | POA: Diagnosis present

## 2023-01-12 DIAGNOSIS — Z1612 Extended spectrum beta lactamase (ESBL) resistance: Secondary | ICD-10-CM | POA: Diagnosis present

## 2023-01-12 DIAGNOSIS — N390 Urinary tract infection, site not specified: Secondary | ICD-10-CM | POA: Diagnosis not present

## 2023-01-12 DIAGNOSIS — B9620 Unspecified Escherichia coli [E. coli] as the cause of diseases classified elsewhere: Secondary | ICD-10-CM | POA: Diagnosis present

## 2023-01-12 DIAGNOSIS — Z9889 Other specified postprocedural states: Secondary | ICD-10-CM

## 2023-01-12 DIAGNOSIS — Z87891 Personal history of nicotine dependence: Secondary | ICD-10-CM

## 2023-01-12 DIAGNOSIS — Z981 Arthrodesis status: Secondary | ICD-10-CM

## 2023-01-12 DIAGNOSIS — J45909 Unspecified asthma, uncomplicated: Secondary | ICD-10-CM | POA: Diagnosis present

## 2023-01-12 LAB — CBC WITH DIFFERENTIAL/PLATELET
Abs Immature Granulocytes: 0.03 10*3/uL (ref 0.00–0.07)
Basophils Absolute: 0 10*3/uL (ref 0.0–0.1)
Basophils Relative: 0 %
Eosinophils Absolute: 0 10*3/uL (ref 0.0–0.5)
Eosinophils Relative: 0 %
HCT: 42.6 % (ref 36.0–46.0)
Hemoglobin: 13.6 g/dL (ref 12.0–15.0)
Immature Granulocytes: 0 %
Lymphocytes Relative: 9 %
Lymphs Abs: 0.9 10*3/uL (ref 0.7–4.0)
MCH: 25.2 pg — ABNORMAL LOW (ref 26.0–34.0)
MCHC: 31.9 g/dL (ref 30.0–36.0)
MCV: 79 fL — ABNORMAL LOW (ref 80.0–100.0)
Monocytes Absolute: 0.7 10*3/uL (ref 0.1–1.0)
Monocytes Relative: 7 %
Neutro Abs: 8.2 10*3/uL — ABNORMAL HIGH (ref 1.7–7.7)
Neutrophils Relative %: 84 %
Platelets: 252 10*3/uL (ref 150–400)
RBC: 5.39 MIL/uL — ABNORMAL HIGH (ref 3.87–5.11)
RDW: 14.9 % (ref 11.5–15.5)
WBC: 9.9 10*3/uL (ref 4.0–10.5)
nRBC: 0 % (ref 0.0–0.2)

## 2023-01-12 LAB — COMPREHENSIVE METABOLIC PANEL
ALT: 54 U/L — ABNORMAL HIGH (ref 0–44)
AST: 67 U/L — ABNORMAL HIGH (ref 15–41)
Albumin: 3.6 g/dL (ref 3.5–5.0)
Alkaline Phosphatase: 82 U/L (ref 38–126)
Anion gap: 13 (ref 5–15)
BUN: 13 mg/dL (ref 8–23)
CO2: 24 mmol/L (ref 22–32)
Calcium: 9 mg/dL (ref 8.9–10.3)
Chloride: 98 mmol/L (ref 98–111)
Creatinine, Ser: 1.1 mg/dL — ABNORMAL HIGH (ref 0.44–1.00)
GFR, Estimated: 57 mL/min — ABNORMAL LOW (ref >60)
Glucose, Bld: 162 mg/dL — ABNORMAL HIGH (ref 70–99)
Potassium: 3.8 mmol/L (ref 3.5–5.1)
Sodium: 135 mmol/L (ref 135–145)
Total Bilirubin: 1.6 mg/dL — ABNORMAL HIGH (ref 0.3–1.2)
Total Protein: 7.9 g/dL (ref 6.5–8.1)

## 2023-01-12 LAB — URINALYSIS, W/ REFLEX TO CULTURE (INFECTION SUSPECTED)
Bilirubin Urine: NEGATIVE
Glucose, UA: NEGATIVE mg/dL
Ketones, ur: NEGATIVE mg/dL
Nitrite: NEGATIVE
Protein, ur: 100 mg/dL — AB
Specific Gravity, Urine: 1.023 (ref 1.005–1.030)
WBC, UA: >50 WBC/hpf (ref 0–5)
pH: 5 (ref 5.0–8.0)

## 2023-01-12 LAB — RESP PANEL BY RT-PCR (RSV, FLU A&B, COVID)  RVPGX2
Influenza A by PCR: NEGATIVE
Influenza B by PCR: NEGATIVE
Resp Syncytial Virus by PCR: NEGATIVE
SARS Coronavirus 2 by RT PCR: NEGATIVE

## 2023-01-12 LAB — LACTIC ACID, PLASMA: Lactic Acid, Venous: 1.2 mmol/L (ref 0.5–1.9)

## 2023-01-12 LAB — PROTIME-INR
INR: 1.1 (ref 0.8–1.2)
Prothrombin Time: 14.7 s (ref 11.4–15.2)

## 2023-01-12 LAB — PROCALCITONIN: Procalcitonin: 2.27 ng/mL

## 2023-01-12 MED ORDER — ONDANSETRON HCL 4 MG/2ML IJ SOLN: 4.0000 mg | Freq: Four times a day (QID) | INTRAMUSCULAR | Status: DC | PRN

## 2023-01-12 MED ORDER — SODIUM CHLORIDE 0.9 % IV SOLN
2.0000 g | Freq: Once | INTRAVENOUS | Status: AC
  Administered 2023-01-12: 2 g via INTRAVENOUS
  Filled 2023-01-12: qty 20

## 2023-01-12 MED ORDER — ONDANSETRON HCL 4 MG/2ML IJ SOLN
4.0000 mg | Freq: Once | INTRAMUSCULAR | Status: AC
  Administered 2023-01-12: 4 mg via INTRAVENOUS
  Filled 2023-01-12: qty 2

## 2023-01-12 MED ORDER — ACETAMINOPHEN 500 MG PO TABS
1000.0000 mg | ORAL_TABLET | Freq: Once | ORAL | Status: AC
  Administered 2023-01-12: 1000 mg via ORAL
  Filled 2023-01-12: qty 2

## 2023-01-12 MED ORDER — VANCOMYCIN HCL IN DEXTROSE 1-5 GM/200ML-% IV SOLN: 1000.0000 mg | Freq: Once | INTRAVENOUS | Status: DC

## 2023-01-12 MED ORDER — ALBUTEROL SULFATE (2.5 MG/3ML) 0.083% IN NEBU: 2.5000 mg | INHALATION_SOLUTION | RESPIRATORY_TRACT | Status: DC | PRN

## 2023-01-12 MED ORDER — MAGNESIUM HYDROXIDE 400 MG/5ML PO SUSP: 30.0000 mL | Freq: Every day | ORAL | Status: DC | PRN

## 2023-01-12 MED ORDER — TRAZODONE HCL 50 MG PO TABS: 25.0000 mg | ORAL_TABLET | Freq: Every evening | ORAL | Status: DC | PRN

## 2023-01-12 MED ORDER — SODIUM CHLORIDE 0.9 % IV SOLN
2.0000 g | INTRAVENOUS | Status: DC
  Administered 2023-01-13 – 2023-01-14 (×2): 2 g via INTRAVENOUS
  Filled 2023-01-12 (×2): qty 20

## 2023-01-12 MED ORDER — SODIUM CHLORIDE 0.9 % IV SOLN: 2.0000 g | Freq: Once | INTRAVENOUS | Status: DC

## 2023-01-12 MED ORDER — ACETAMINOPHEN 650 MG RE SUPP: 650.0000 mg | Freq: Four times a day (QID) | RECTAL | Status: DC | PRN

## 2023-01-12 MED ORDER — METRONIDAZOLE 500 MG/100ML IV SOLN: 500.0000 mg | Freq: Once | INTRAVENOUS | Status: DC

## 2023-01-12 MED ORDER — GABAPENTIN 300 MG PO CAPS
300.0000 mg | ORAL_CAPSULE | Freq: Three times a day (TID) | ORAL | Status: DC
  Administered 2023-01-12 – 2023-01-18 (×16): 300 mg via ORAL
  Filled 2023-01-12 (×16): qty 1

## 2023-01-12 MED ORDER — ENOXAPARIN SODIUM 100 MG/ML IJ SOSY
0.5000 mg/kg | PREFILLED_SYRINGE | INTRAMUSCULAR | Status: DC
  Administered 2023-01-12 – 2023-01-17 (×6): 82.5 mg via SUBCUTANEOUS
  Filled 2023-01-12 (×6): qty 1

## 2023-01-12 MED ORDER — ACETAMINOPHEN 325 MG PO TABS
650.0000 mg | ORAL_TABLET | Freq: Four times a day (QID) | ORAL | Status: DC | PRN
  Administered 2023-01-13 – 2023-01-14 (×2): 650 mg via ORAL
  Filled 2023-01-12 (×2): qty 2

## 2023-01-12 MED ORDER — ONDANSETRON HCL 4 MG PO TABS: 4.0000 mg | ORAL_TABLET | Freq: Four times a day (QID) | ORAL | Status: DC | PRN

## 2023-01-12 MED ORDER — TRAMADOL HCL 50 MG PO TABS
50.0000 mg | ORAL_TABLET | Freq: Four times a day (QID) | ORAL | Status: DC | PRN
  Administered 2023-01-15 – 2023-01-17 (×3): 50 mg via ORAL
  Filled 2023-01-12 (×3): qty 1

## 2023-01-12 MED ORDER — LACTATED RINGERS IV SOLN
150.0000 mL/h | INTRAVENOUS | Status: DC
  Administered 2023-01-12 – 2023-01-13 (×2): 150 mL/h via INTRAVENOUS

## 2023-01-12 MED ORDER — AMLODIPINE BESYLATE 10 MG PO TABS
10.0000 mg | ORAL_TABLET | Freq: Every day | ORAL | Status: DC
  Administered 2023-01-13: 10 mg via ORAL
  Filled 2023-01-12: qty 1

## 2023-01-12 MED ORDER — LISINOPRIL 10 MG PO TABS
5.0000 mg | ORAL_TABLET | Freq: Every day | ORAL | Status: DC
  Administered 2023-01-13: 5 mg via ORAL
  Filled 2023-01-12: qty 1

## 2023-01-12 MED ORDER — HYDROCODONE-ACETAMINOPHEN 5-325 MG PO TABS: 1.0000 | ORAL_TABLET | ORAL | Status: DC | PRN

## 2023-01-12 MED ORDER — KETOROLAC TROMETHAMINE 30 MG/ML IJ SOLN
15.0000 mg | Freq: Once | INTRAMUSCULAR | Status: AC
  Administered 2023-01-12: 15 mg via INTRAVENOUS
  Filled 2023-01-12: qty 1

## 2023-01-12 MED ORDER — METHOCARBAMOL 500 MG PO TABS
500.0000 mg | ORAL_TABLET | Freq: Four times a day (QID) | ORAL | Status: DC | PRN
  Administered 2023-01-16 (×3): 500 mg via ORAL
  Filled 2023-01-12 (×3): qty 1

## 2023-01-12 MED ORDER — LACTATED RINGERS IV BOLUS
1000.0000 mL | Freq: Once | INTRAVENOUS | Status: AC
  Administered 2023-01-12: 1000 mL via INTRAVENOUS

## 2023-01-12 NOTE — Assessment & Plan Note (Signed)
- 

## 2023-01-12 NOTE — Assessment & Plan Note (Signed)
-   We will continue statin therapy. 

## 2023-01-12 NOTE — ED Notes (Signed)
Assumed care of pt at this time. Pt is AAXO4, on CCM, VS stable and WNL. Call light within reach, side rails up x2. No needs identified at this time.

## 2023-01-12 NOTE — H&P (Signed)
[place]   PATIENT NAME: Alexandra Macias    MR#:  [id number]  DATE OF BIRTH:  10-21-1960  DATE OF ADMISSION:  01/12/2023  PRIMARY CARE PHYSICIAN: Wilford Corner, PA-C   Patient is coming from: Home  REQUESTING/REFERRING PHYSICIAN: Chesley Noon, MD  CHIEF COMPLAINT:   Chief Complaint  Patient presents with   Generalized Body Aches    HISTORY OF PRESENT ILLNESS:  Alexandra Macias is a 62 y.o. African-American female with medical history significant for essential hypertension and DVT, who presented to the emergency room with acute onset of lower abdominal pain for the last 3 days extending to all over the abdomen with associated malaise, tactile fever and chills, urinary frequency and urgency and dysuria.  No flank pain or hematuria.  She had some nausea without or vomiting or diarrhea.  No cough or wheezing or dyspnea.  No chest pain or palpitations.  No bleeding diathesis.  ED Course: When she came to the ER, temperature was 103 with heart rate of 111 and pulse asked me 392% on room air with otherwise normal vital signs.  Later on respiratory rate was up to 32.  Labs revealed a blood glucose 162 with AST 67 ALT 54 and total bili 1.6.  CBC showed microcytosis and was otherwise unremarkable.  Respiratory panel came back and UA came back positive for UTI urine culture as well as blood cultures were sent. EKG as reviewed by me : EKG showed sinus tachycardia with rate 107 with poor R wave progression. Imaging: Portable chest ray showed no acute cardiopulmonary disease.  The patient was given 1 L bolus of IV lactated Ringer, 4 mg of IV Zofran, 15 mg IV Toradol and 2 g of IV Rocephin as well as 1 g p.o. Tylenol.  She will be admitted to a medical telemetry bed for further evaluation and management. PAST MEDICAL HISTORY:   Past Medical History:  Diagnosis Date   DVT (deep venous thrombosis) (HCC) 2005   post op hysterectomy   Hypertension    Medical history non-contributory      PAST SURGICAL HISTORY:   Past Surgical History:  Procedure Laterality Date   ABDOMINAL HYSTERECTOMY  2005   FOOT FUSION  2015   right-fx   HARDWARE REMOVAL Right 07/05/2014   Procedure: REMOVAL OF DEEP IMPLANTS OF RIGHT ANKLE;  Surgeon: Toni Arthurs, MD;  Location: [facility];  Service: Orthopedics;  Laterality: Right;   ORIF FOOT FRACTURE  2010   left   ORIF WRIST FRACTURE     both rt and lt    SOCIAL HISTORY:   Social History   Tobacco Use   Smoking status: Former    Current packs/day: 0.00    Types: Cigarettes    Quit date: 12/26/2013    Years since quitting: 9.0   Smokeless tobacco: Never  Substance Use Topics   Alcohol use: Yes    Comment: rare    FAMILY HISTORY:   Family History  Problem Relation Age of Onset   Breast cancer Neg Hx     DRUG ALLERGIES:  No Known Allergies  REVIEW OF SYSTEMS:   ROS As per history of present illness. All pertinent systems were reviewed above. Constitutional, HEENT, cardiovascular, respiratory, GI, GU, musculoskeletal, neuro, psychiatric, endocrine, integumentary and hematologic systems were reviewed and are otherwise negative/unremarkable except for positive findings mentioned above in the HPI.   MEDICATIONS AT HOME:   Prior to Admission medications   Medication Sig Start  Date End Date Taking? Authorizing Provider  albuterol (PROVENTIL HFA;VENTOLIN HFA) 108 (90 Base) MCG/ACT inhaler Inhale 2 puffs into the lungs every 4 (four) hours as needed for wheezing or shortness of breath. 04/23/17   Triplett, Cari B, FNP  amLODipine (NORVASC) 10 MG tablet Take 10 mg by mouth daily.    [provider]  etodolac (LODINE) 400 MG tablet Take 1 tablet (400 mg total) by mouth 2 (two) times daily. 06/11/22   Tommi Rumps, PA-C  gabapentin (NEURONTIN) 300 MG capsule 1 po q hs x 3 days, then 1 po bid x 3 days, then 1 po tid. 06/18/22   Drake Leach, PA-C  HYDROcodone-acetaminophen (NORCO/VICODIN) 5-325 MG tablet Take  1 tablet by mouth every 4 (four) hours as needed. 12/22/22   Minna Antis, MD  lisinopril (ZESTRIL) 5 MG tablet Take 5 mg by mouth daily.    [provider]  methocarbamol (ROBAXIN) 500 MG tablet 1-2 tablets every 6 hours prn muscle spasms 06/11/22   Tommi Rumps, PA-C  methylPREDNISolone (MEDROL DOSEPAK) 4 MG TBPK tablet 6,5,4,3,2,1 06/13/22   Orvil Feil, PA-C  oxyCODONE (OXY IR/ROXICODONE) 5 MG immediate release tablet Take 1 tablet (5 mg total) by mouth every 6 (six) hours as needed for severe pain. 06/11/22   Tommi Rumps, PA-C  traMADol (ULTRAM) 50 MG tablet Take 1 tablet (50 mg total) by mouth every 6 (six) hours as needed. 12/24/22 12/24/23  Jene Every, MD      VITAL SIGNS:  Blood pressure 100/60, pulse 72, temperature 97.8 F (36.6 C), temperature source Oral, resp. rate 19, height 5\' 7"  (1.702 m), weight (!) 165.6 kg, SpO2 99%.  PHYSICAL EXAMINATION:  Physical Exam  GENERAL:  62 y.o.-year-old African-American female patient lying in the bed with no acute distress.  EYES: Pupils equal, round, reactive to light and accommodation. No scleral icterus. Extraocular muscles intact.  HEENT: Head atraumatic, normocephalic. Oropharynx and nasopharynx clear.  NECK:  Supple, no jugular venous distention. No thyroid enlargement, no tenderness.  LUNGS: Normal breath sounds bilaterally, no wheezing, rales,rhonchi or crepitation. No use of accessory muscles of respiration.  CARDIOVASCULAR: Regular rate and rhythm, S1, S2 normal. No murmurs, rubs, or gallops.  ABDOMEN: Soft, nondistended, nontender. Bowel sounds present. No organomegaly or mass.  EXTREMITIES: No pedal edema, cyanosis, or clubbing.  NEUROLOGIC: Cranial nerves II through XII are intact. Muscle strength 5/5 in all extremities. Sensation intact. Gait not checked.  PSYCHIATRIC: The patient is alert and oriented x 3.  Normal affect and good eye contact. SKIN: No obvious rash, lesion, or ulcer.   LABORATORY  PANEL:   CBC Recent Labs  Lab 01/12/23 1719  WBC 9.9  HGB 13.6  HCT 42.6  PLT 252   ------------------------------------------------------------------------------------------------------------------  Chemistries  Recent Labs  Lab 01/12/23 1719  NA 135  K 3.8  CL 98  CO2 24  GLUCOSE 162*  BUN 13  CREATININE 1.10*  CALCIUM 9.0  AST 67*  ALT 54*  ALKPHOS 82  BILITOT 1.6*   ------------------------------------------------------------------------------------------------------------------  Cardiac Enzymes No results for input(s): "TROPONINI" in the last 168 hours. ------------------------------------------------------------------------------------------------------------------  RADIOLOGY:  DG Chest 1 View  Result Date: 01/12/2023 CLINICAL DATA:  Sepsis, pain, weakness. EXAM: CHEST  1 VIEW COMPARISON:  04/23/2017 FINDINGS: The heart size and mediastinal contours are within normal limits. There is no evidence of pulmonary edema, consolidation, pneumothorax or pleural fluid. The visualized skeletal structures are unremarkable. IMPRESSION: No active disease. Electronically Signed   By: Irish Lack  M.D.   On: 01/12/2023 18:33      IMPRESSION AND PLAN:  Assessment and Plan: * Sepsis due to gram-negative UTI [facility]) - The patient be admitted to a medical telemetry bed. - Sepsis manifested by fever, tachycardia and tachypnea - We will continue antibiotic therapy with IV Rocephin. - We will continue hydration with IV lactated ringer. - Will follow blood and urine cultures.  Essential hypertension - We will continue antihypertensive therapy.  Type 2 diabetes mellitus with peripheral neuropathy (HCC) - The patient will be placed on supplemental coverage with NovoLog. - We will continue Neurontin.  Dyslipidemia - We will continue statin therapy.   DVT prophylaxis: Lovenox. Advanced Care Planning:  Code Status: full code. Family Communication:  The plan of care was  discussed in details with the patient (and family). I answered all questions. The patient agreed to proceed with the above mentioned plan. Further management will depend upon hospital course. Disposition Plan: Back to previous home environment Consults called: none. All the records are reviewed and case discussed with ED provider.  Status is: Inpatient  At the time of the admission, it appears that the appropriate admission status for this patient is inpatient.  This is judged to be reasonable and necessary in order to provide the required intensity of service to ensure the patient's safety given the presenting symptoms, physical exam findings and initial radiographic and laboratory data in the context of comorbid conditions.  The patient requires inpatient status due to high intensity of service, high risk of further deterioration and high frequency of surveillance required.  I certify that at the time of admission, it is my clinical judgment that the patient will require inpatient hospital care extending more than 2 midnights.                            Dispo: The patient is from: Home              Anticipated d/c is to: Home              Patient currently is not medically stable to d/c.              Difficult to place patient: No  Hannah Beat M.D on 01/12/2023 at 11:48 PM  [facility]   From 7 PM-7 AM, contact night-coverage [email or web address]  CC: Primary care physician; Whitaker, [employer], PA-C

## 2023-01-12 NOTE — Consult Note (Signed)
PHARMACY -  BRIEF ANTIBIOTIC NOTE   Pharmacy has received consult(s) for Vancomycin and Cefepime from an ED provider.  The patient's profile has been reviewed for ht/wt/allergies/indication/available labs.    One time order(s) placed for Vancomycin 1g IV and Cefepime 2g IV.  Further antibiotics/pharmacy consults should be ordered by admitting physician if indicated.                       Thank you, Bettey Costa 01/12/2023  9:07 PM

## 2023-01-12 NOTE — Assessment & Plan Note (Addendum)
- 

## 2023-01-12 NOTE — Sepsis Progress Note (Addendum)
Elink monitoring for the code sepsis protocol.  Code sepsis ordered almost 4 hours after blood culures & lactic collected.  Antibiotics given after code sepsis ordered.

## 2023-01-12 NOTE — Assessment & Plan Note (Addendum)
-   The patient be admitted to a medical telemetry bed. - Sepsis manifested by fever, tachycardia and tachypnea - We will continue antibiotic therapy with IV Rocephin. - We will continue hydration with IV lactated ringer. - Will follow blood and urine cultures.

## 2023-01-12 NOTE — ED Provider Notes (Signed)
[facility] Provider Note    Event Date/Time   First MD Initiated Contact with Patient 01/12/23 1725     (approximate)   History   Chief Complaint Generalized Body Aches   HPI  Alexandra Macias is a 62 y.o. female with past medical history of hypertension and DVT who presents to the ED complaining of bodyaches.  Patient reports that she has been dealing with aches and pains across her entire body for the past 3 days.  She has been feeling malaised with chills, but has not taken her temperature at home.  She reports some nausea but has not had any vomiting or diarrhea.  She denies any cough, chest pain, shortness of breath, or abdominal pain.  She states she had some dysuria couple of days ago but this has resolved and she denies any flank pain.  She is not aware of any sick contacts.     Physical Exam   Triage Vital Signs: ED Triage Vitals  Encounter Vitals Group     BP 01/12/23 1706 (!) 112/91     Systolic BP Percentile --      Diastolic BP Percentile --      Pulse Rate 01/12/23 1706 (!) 111     Resp 01/12/23 1706 19     Temp 01/12/23 1706 (!) 103 F (39.4 C)     Temp src --      SpO2 01/12/23 1706 92 %     Weight 01/12/23 1717 (!) 365 lb (165.6 kg)     Height 01/12/23 1717 5\' 7"  (1.702 m)     Head Circumference --      Peak Flow --      Pain Score 01/12/23 1716 10     Pain Loc --      Pain Education --      Exclude from Growth Chart --     Most recent vital signs: Vitals:   01/12/23 2130 01/12/23 2145  BP:    Pulse: 75 76  Resp: 16 (!) 21  Temp:    SpO2: 99% 99%    Constitutional: Alert and oriented. Eyes: Conjunctivae are normal. Head: Atraumatic. Nose: No congestion/rhinnorhea. Mouth/Throat: Mucous membranes are moist.  Neck: Supple with no meningismus. Cardiovascular: Tachycardic, regular rhythm. Grossly normal heart sounds.  2+ radial pulses bilaterally. Respiratory: Normal respiratory effort.  No retractions. Lungs  CTAB. Gastrointestinal: Soft and nontender. No distention. Musculoskeletal: No lower extremity tenderness nor edema.  Neurologic:  Normal speech and language. No gross focal neurologic deficits are appreciated.    ED Results / Procedures / Treatments   Labs (all labs ordered are listed, but only abnormal results are displayed) Labs Reviewed  COMPREHENSIVE METABOLIC PANEL - Abnormal; Notable for the following components:      Result Value   Glucose, Bld 162 (*)    Creatinine, Ser 1.10 (*)    AST 67 (*)    ALT 54 (*)    Total Bilirubin 1.6 (*)    GFR, Estimated 57 (*)    All other components within normal limits  CBC WITH DIFFERENTIAL/PLATELET - Abnormal; Notable for the following components:   RBC 5.39 (*)    MCV 79.0 (*)    MCH 25.2 (*)    Neutro Abs 8.2 (*)    All other components within normal limits  URINALYSIS, W/ REFLEX TO CULTURE (INFECTION SUSPECTED) - Abnormal; Notable for the following components:   Color, Urine AMBER (*)    APPearance CLOUDY (*)  Hgb urine dipstick MODERATE (*)    Protein, ur 100 (*)    Leukocytes,Ua MODERATE (*)    Bacteria, UA RARE (*)    All other components within normal limits  RESP PANEL BY RT-PCR (RSV, FLU A&B, COVID)  RVPGX2  CULTURE, BLOOD (ROUTINE X 2)  CULTURE, BLOOD (ROUTINE X 2)  URINE CULTURE  LACTIC ACID, PLASMA  PROTIME-INR  PROCALCITONIN     EKG  ED ECG REPORT I, Chesley Noon, the attending physician, personally viewed and interpreted this ECG.   Date: 01/12/2023  EKG Time: 17:10  Rate: 107  Rhythm: sinus tachycardia  Axis: RAD  Intervals:none  ST&T Change: None  RADIOLOGY Chest x-ray reviewed and interpreted by me with no infiltrate, edema, or effusion.  PROCEDURES:  Critical Care performed: No  Procedures   MEDICATIONS ORDERED IN ED: Medications  ondansetron (ZOFRAN) injection 4 mg (4 mg Intravenous Given 01/12/23 1749)  lactated ringers bolus 1,000 mL (0 mLs Intravenous Stopped 01/12/23 2015)   acetaminophen (TYLENOL) tablet 1,000 mg (1,000 mg Oral Given 01/12/23 1751)  ketorolac (TORADOL) 30 MG/ML injection 15 mg (15 mg Intravenous Given 01/12/23 1908)  cefTRIAXone (ROCEPHIN) 2 g in sodium chloride 0.9 % 100 mL IVPB (0 g Intravenous Stopped 01/12/23 2158)     IMPRESSION / MDM / ASSESSMENT AND PLAN / ED COURSE  I reviewed the triage vital signs and the nursing notes.                              62 y.o. female with past medical history of hypertension and DVT who presents to the ED complaining of generalized bodyaches with nausea and malaise for the past 3 days.  Patient's presentation is most consistent with acute presentation with potential threat to life or bodily function.  Differential diagnosis includes, but is not limited to, sepsis, pneumonia, UTI, viral illness, COVID-19, influenza, dehydration, electrolyte abnormality, AKI, anemia.  Patient nontoxic-appearing and in no acute distress, vital signs remarkable for fever and tachycardia but otherwise reassuring.  Her constellation of symptoms seem most consistent with viral illness but we will initiate sepsis workup given fever and tachycardia.  Chest x-ray, urinalysis, and viral testing is pending at this time.  We will hydrate with IV fluids, treat symptomatically with IV Zofran and Tylenol, but hold off on antibiotics.  Viral testing is negative and procalcitonin is elevated, findings are concerning for bacterial sepsis and urinalysis appears consistent with UTI.  Patient treated with IV Rocephin and case discussed with hospitalist for admission.  No significant anemia, leukocytosis, electrolyte abnormality, or AKI noted.  Lactic acid within normal limits.      FINAL CLINICAL IMPRESSION(S) / ED DIAGNOSES   Final diagnoses:  Sepsis without acute organ dysfunction, due to unspecified organism [facility])  Urinary tract infection without hematuria, site unspecified     Rx / DC Orders   ED Discharge Orders     None         Note:  This document was prepared using Dragon voice recognition software and may include unintentional dictation errors.   Chesley Noon, MD 01/12/23 2205

## 2023-01-12 NOTE — Consult Note (Addendum)
CODE SEPSIS - PHARMACY COMMUNICATION  **Broad Spectrum Antibiotics should be administered within 1 hour of Sepsis diagnosis**  Time Code Sepsis Called/Page Received: 2058  Antibiotics Ordered: ceftriaxone  Time of 1st antibiotic administration: 2126  Additional action taken by pharmacy: none  If necessary, Name of Provider/Nurse Contacted: n/a    Bettey Costa ,PharmD Clinical Pharmacist  01/12/2023  9:28 PM

## 2023-01-12 NOTE — ED Notes (Signed)
Provided the pt with meal tray and drink as requested.

## 2023-01-12 NOTE — ED Triage Notes (Signed)
Pt sts that she has been having body pain for the last two days. Pt also sts that she feel weak and tired.

## 2023-01-12 NOTE — Progress Notes (Signed)
Anticoagulation monitoring(Lovenox):  62 yo female ordered Lovenox 40 mg Q24h    Filed Weights   01/12/23 1717  Weight: (!) 165.6 kg (365 lb)   BMI 57.2   Lab Results  Component Value Date   CREATININE 1.10 (H) 01/12/2023   CREATININE 0.60 12/24/2022   CREATININE 0.99 12/22/2022   Estimated Creatinine Clearance: 87.5 mL/min (A) (by C-G formula based on SCr of 1.1 mg/dL (H)). Hemoglobin & Hematocrit     Component Value Date/Time   HGB 13.6 01/12/2023 1719   HCT 42.6 01/12/2023 1719     Per Protocol for Patient with estCrcl > 30 ml/min and BMI > 30, will transition to Lovenox 82.5 mg Q24h.

## 2023-01-12 NOTE — ED Triage Notes (Signed)
Arrives from home via [facility]. C/O soreness all over x 2 days.  VS wnl.

## 2023-01-12 NOTE — ED Notes (Signed)
EDP at bedside  

## 2023-01-13 ENCOUNTER — Encounter: Payer: Self-pay | Admitting: Family Medicine

## 2023-01-13 ENCOUNTER — Inpatient Hospital Stay: Payer: Medicare Other

## 2023-01-13 DIAGNOSIS — A4150 Gram-negative sepsis, unspecified: Secondary | ICD-10-CM | POA: Diagnosis not present

## 2023-01-13 DIAGNOSIS — N390 Urinary tract infection, site not specified: Secondary | ICD-10-CM | POA: Diagnosis not present

## 2023-01-13 LAB — CBC
HCT: 36.4 % (ref 36.0–46.0)
Hemoglobin: 11.8 g/dL — ABNORMAL LOW (ref 12.0–15.0)
MCH: 25.3 pg — ABNORMAL LOW (ref 26.0–34.0)
MCHC: 32.4 g/dL (ref 30.0–36.0)
MCV: 78.1 fL — ABNORMAL LOW (ref 80.0–100.0)
Platelets: 219 10*3/uL (ref 150–400)
RBC: 4.66 MIL/uL (ref 3.87–5.11)
RDW: 15 % (ref 11.5–15.5)
WBC: 9.4 10*3/uL (ref 4.0–10.5)
nRBC: 0 % (ref 0.0–0.2)

## 2023-01-13 LAB — PROCALCITONIN: Procalcitonin: 1.58 ng/mL

## 2023-01-13 LAB — BASIC METABOLIC PANEL
Anion gap: 9 (ref 5–15)
BUN: 18 mg/dL (ref 8–23)
CO2: 26 mmol/L (ref 22–32)
Calcium: 8.5 mg/dL — ABNORMAL LOW (ref 8.9–10.3)
Chloride: 98 mmol/L (ref 98–111)
Creatinine, Ser: 1.1 mg/dL — ABNORMAL HIGH (ref 0.44–1.00)
GFR, Estimated: 57 mL/min — ABNORMAL LOW (ref >60)
Glucose, Bld: 158 mg/dL — ABNORMAL HIGH (ref 70–99)
Potassium: 3.4 mmol/L — ABNORMAL LOW (ref 3.5–5.1)
Sodium: 133 mmol/L — ABNORMAL LOW (ref 135–145)

## 2023-01-13 LAB — HIV ANTIBODY (ROUTINE TESTING W REFLEX): HIV Screen 4th Generation wRfx: NONREACTIVE

## 2023-01-13 LAB — CORTISOL-AM, BLOOD: Cortisol - AM: 14 ug/dL (ref 6.7–22.6)

## 2023-01-13 LAB — PROTIME-INR
INR: 1.2 (ref 0.8–1.2)
Prothrombin Time: 15.2 s (ref 11.4–15.2)

## 2023-01-13 MED ORDER — IOHEXOL 350 MG/ML SOLN
100.0000 mL | Freq: Once | INTRAVENOUS | Status: AC | PRN
  Administered 2023-01-13: 100 mL via INTRAVENOUS

## 2023-01-13 MED ORDER — IBUPROFEN 400 MG PO TABS
400.0000 mg | ORAL_TABLET | Freq: Once | ORAL | Status: AC
  Administered 2023-01-13: 400 mg via ORAL
  Filled 2023-01-13: qty 1

## 2023-01-13 NOTE — Progress Notes (Signed)
  PROGRESS NOTE    Alexandra Macias  ZOX:[id number] DOB: March 27, 1961 DOA: 01/12/2023 PCP: Debbra Riding Hestle, PA-C  [id number]  LOS: 1 day   Brief hospital course:   Assessment & Plan: Alexandra Macias is a 62 y.o. African-American female with medical history significant for essential hypertension and DVT, who presented to the emergency room with acute onset of lower abdominal pain.    * Sepsis  --Sepsis manifested by fever, tachycardia and tachypnea.  Without dysuria and such recurrent high fever, seems unlikely sepsis is just from UTI. --CT a/p today  Possible UTI (HCC) --started on ceftriaxone on admission --cont ceftriaxone pending urine cx  LLQ abdominal pain --pt had a CT a/p on 12/24/22 for the same pain, and showed colitis and extensive diverticulosis.   --CT a/p today  Essential hypertension - hold amlodipine and Lisinopril due to intermittent low BP  Type 2 diabetes mellitus with peripheral neuropathy (HCC) - no need for BG checks and SSI - We will continue Neurontin.  Dyslipidemia - We will continue statin therapy.  Hypokalemia --monitor and supplement PRN   DVT prophylaxis: Lovenox SQ Code Status: Full code  Family Communication:  Level of care: Telemetry Medical Dispo:   The patient is from: home Anticipated d/c is to: home Anticipated d/c date is: 2-3 days   Subjective and Interval History:  Pt continued to have fevers.  Reported LLQ pain.  No dysuria, cough, diarrhea.   Objective: Vitals:   01/13/23 1426 01/13/23 1436 01/13/23 1620 01/13/23 1807  BP: (!) 71/53 92/63 119/77 (!) 113/55  Pulse: 78 76 93 74  Resp: 18  20 16   Temp: 98.5 F (36.9 C)  99 F (37.2 C) 98.7 F (37.1 C)  TempSrc: Oral  Oral Oral  SpO2: 95%  (!) 82% 92%  Weight:      Height:        Intake/Output Summary (Last 24 hours) at 01/13/2023 2008 Last data filed at 01/13/2023 1244 Gross per 24 hour  Intake 3090.53 ml  Output --  Net 3090.53 ml   Filed Weights    01/12/23 1717  Weight: (!) 165.6 kg    Examination:   Constitutional: NAD, AAOx3 HEENT: conjunctivae and lids normal, EOMI CV: No cyanosis.   RESP: normal respiratory effort, on RA Extremities: edema in BLE, R>L chronic SKIN: warm, dry Neuro: II - XII grossly intact.   Psych: Normal mood and affect.  Appropriate judgement and reason   Data Reviewed: I have personally reviewed labs and imaging studies  Time spent: 50 minutes  Darlin Priestly, MD [facility] If 7PM-7AM, please contact night-coverage 01/13/2023, 8:08 PM

## 2023-01-13 NOTE — ED Notes (Signed)
Patient given ginger ale per request

## 2023-01-13 NOTE — TOC CM/SW Note (Signed)
Transition of Care [facility]) - Inpatient Brief Assessment   Patient Details  Name: Alexandra Macias MRN: [id number] Date of Birth: 1961/03/09  Transition of Care [facility]) CM/SW Contact:    Chapman Fitch, RN Phone Number: 01/13/2023, 9:04 AM   Clinical Narrative:   Transition of Care (TOC) Screening Note   Patient Details  Name: Alexandra Macias Date of Birth: [birth year]   Transition of Care [facility]) CM/SW Contact:    Chapman Fitch, RN Phone Number: 01/13/2023, 9:04 AM    Transition of Care Department [facility]) has reviewed patient and no TOC needs have been identified at this time.  If new patient transition needs arise, please place a TOC consult.    Transition of Care Asessment: Insurance and Status: Insurance coverage has been reviewed Patient has primary care physician: Yes     Prior/Current Home Services: No current home services Social Determinants of Health Reivew: SDOH reviewed no interventions necessary Readmission risk has been reviewed: Yes Transition of care needs: no transition of care needs at this time

## 2023-01-13 NOTE — Plan of Care (Signed)
  Problem: Clinical Measurements: Goal: Diagnostic test results will improve Outcome: Progressing Goal: Signs and symptoms of infection will decrease Outcome: Progressing

## 2023-01-13 NOTE — ED Notes (Addendum)
Lab to come draw blood d/t IV not pulling and patient being a hard stick.  Lab made aware and agree to come.

## 2023-01-13 NOTE — Progress Notes (Signed)
   01/13/23 0820  Assess: MEWS Score  Temp (!) 101.7 F (38.7 C)  BP 127/81  MAP (mmHg) 88  Pulse Rate 97  Resp 20  SpO2 95 %  O2 Device Room Air  Assess: MEWS Score  MEWS Temp 2  MEWS Systolic 0  MEWS Pulse 0  MEWS RR 0  MEWS LOC 0  MEWS Score 2  MEWS Score Color Yellow  Assess: if the MEWS score is Yellow or Red  Were vital signs accurate and taken at a resting state? Yes  Does the patient meet 2 or more of the SIRS criteria? No  MEWS guidelines implemented  Yes, yellow  Treat  MEWS Interventions Considered administering scheduled or prn medications/treatments as ordered  Take Vital Signs  Increase Vital Sign Frequency  Yellow: Q2hr x1, continue Q4hrs until patient remains green for 12hrs  Escalate  MEWS: Escalate Yellow: Discuss with charge nurse and consider notifying provider and/or RRT  Notify: Charge Nurse/RN  Name of Charge Nurse/RN Notified Sheliah Hatch RN  Provider Notification  Provider Name/Title Darlin Priestly  Date Provider Notified 01/13/23  Time Provider Notified 0830  Method of Notification Page  Notification Reason Other (Comment) (patient is a yellow mews)  Provider response No new orders  Date of Provider Response 01/13/23  Time of Provider Response 0834  Assess: SIRS CRITERIA  SIRS Temperature  1  SIRS Pulse 1  SIRS Respirations  0  SIRS WBC 0  SIRS Score Sum  2

## 2023-01-14 DIAGNOSIS — N390 Urinary tract infection, site not specified: Secondary | ICD-10-CM | POA: Diagnosis not present

## 2023-01-14 DIAGNOSIS — A4150 Gram-negative sepsis, unspecified: Secondary | ICD-10-CM | POA: Diagnosis not present

## 2023-01-14 LAB — MAGNESIUM: Magnesium: 1.9 mg/dL (ref 1.7–2.4)

## 2023-01-14 LAB — CBC
HCT: 34.7 % — ABNORMAL LOW (ref 36.0–46.0)
Hemoglobin: 10.9 g/dL — ABNORMAL LOW (ref 12.0–15.0)
MCH: 24.8 pg — ABNORMAL LOW (ref 26.0–34.0)
MCHC: 31.4 g/dL (ref 30.0–36.0)
MCV: 78.9 fL — ABNORMAL LOW (ref 80.0–100.0)
Platelets: 215 10*3/uL (ref 150–400)
RBC: 4.4 MIL/uL (ref 3.87–5.11)
RDW: 14.8 % (ref 11.5–15.5)
WBC: 6.4 10*3/uL (ref 4.0–10.5)
nRBC: 0 % (ref 0.0–0.2)

## 2023-01-14 LAB — BASIC METABOLIC PANEL
Anion gap: 11 (ref 5–15)
BUN: 18 mg/dL (ref 8–23)
CO2: 25 mmol/L (ref 22–32)
Calcium: 8.4 mg/dL — ABNORMAL LOW (ref 8.9–10.3)
Chloride: 95 mmol/L — ABNORMAL LOW (ref 98–111)
Creatinine, Ser: 1.06 mg/dL — ABNORMAL HIGH (ref 0.44–1.00)
GFR, Estimated: 60 mL/min — ABNORMAL LOW (ref >60)
Glucose, Bld: 122 mg/dL — ABNORMAL HIGH (ref 70–99)
Potassium: 3.5 mmol/L (ref 3.5–5.1)
Sodium: 131 mmol/L — ABNORMAL LOW (ref 135–145)

## 2023-01-14 MED ORDER — AMLODIPINE BESYLATE 10 MG PO TABS
10.0000 mg | ORAL_TABLET | Freq: Every day | ORAL | Status: DC
  Administered 2023-01-14 – 2023-01-18 (×5): 10 mg via ORAL
  Filled 2023-01-14 (×5): qty 1

## 2023-01-14 MED ORDER — SODIUM CHLORIDE 0.9 % IV SOLN: INTRAVENOUS | Status: DC | PRN

## 2023-01-14 NOTE — Progress Notes (Addendum)
  PROGRESS NOTE    MYLENE BOW  ZOX:[id number] DOB: 02-11-1961 DOA: 01/12/2023 PCP: Debbra Riding Hestle, PA-C  [id number]  LOS: 2 days   Brief hospital course:   Assessment & Plan: EMMAGRACE RUNKEL is a 62 y.o. African-American female with medical history significant for essential hypertension and DVT, who presented to the emergency room with acute onset of lower abdominal pain.    * Sepsis  --Sepsis manifested by fever, tachycardia and tachypnea.  Without dysuria and such recurrent high fever, seems unlikely sepsis is just from UTI. --CT a/p showed possible left pyelo, no colonic inflammatory change. --obtain RVP  Possible UTI or left pyelonephritis  --started on ceftriaxone on admission, however, continues to have fevers. --cont ceftriaxone pending urine cx  LLQ abdominal pain --pt had a CT a/p on 12/24/22 for the same pain, and showed colitis and extensive diverticulosis.   --current CT a/p showed "Colonic diverticulosis without superimposed acute inflammatory change."  Essential hypertension - amlodipine and Lisinopril held due to intermittent low BP --resume home amlodipine today  Type 2 diabetes mellitus with peripheral neuropathy (HCC) - no need for BG checks and SSI - We will continue Neurontin.  Dyslipidemia - We will continue statin therapy.  Hypokalemia --monitor and supplement PRN  4.0 cm left adrenal mass  --consider surgical consult  Mild hyponatremia --unclear significance    DVT prophylaxis: Lovenox SQ Code Status: Full code  Family Communication:  Level of care: Telemetry Medical Dispo:   The patient is from: home Anticipated d/c is to: home Anticipated d/c date is: 2-3 days   Subjective and Interval History:  Continued to have fevers.  Pt complained of LLQ pain, left back pain.  Reported very little urine output.  Bladder scan performed, no retention.   Objective: Vitals:   01/14/23 0550 01/14/23 0903 01/14/23 1558 01/14/23 1704   BP:  128/72 120/61 (!) 156/74  Pulse:  80 78 79  Resp:  18 17 20   Temp: 99.8 F (37.7 C) 98.5 F (36.9 C) 99.6 F (37.6 C) 99 F (37.2 C)  TempSrc: Oral Oral Oral Oral  SpO2:  96% 98% 96%  Weight:      Height:        Intake/Output Summary (Last 24 hours) at 01/14/2023 1846 Last data filed at 01/13/2023 2206 Gross per 24 hour  Intake 42.42 ml  Output --  Net 42.42 ml   Filed Weights   01/12/23 1717  Weight: (!) 165.6 kg    Examination:   Constitutional: NAD, AAOx3 HEENT: conjunctivae and lids normal, EOMI CV: No cyanosis.   RESP: normal respiratory effort, on RA Neuro: II - XII grossly intact.   Psych: depressed mood and affect.  Appropriate judgement and reason   Data Reviewed: I have personally reviewed labs and imaging studies  Time spent: 35 minutes  Darlin Priestly, MD [facility] If 7PM-7AM, please contact night-coverage 01/14/2023, 6:46 PM

## 2023-01-14 NOTE — Plan of Care (Signed)

## 2023-01-14 NOTE — Progress Notes (Signed)
       CROSS COVER NOTE  NAME: Alexandra Macias MRN: [id number] DOB : [birth year]    Concern as stated by nurse / staff    Message received frim nurse Patient has a fever of 102.2 this is about 50 minutes after I gave tylenol for the fever of 102.6    Pertinent findings on chart review: Patient admitted with sepsis from UTI and abdominal pain consistent with pain presentation with colitis. Currently on rocephin. Urine culture collected 10/8 and still in process  Assessment and  Interventions   Assessment:  Plan: Cooling blanket       Donnie Mesa NP [facility] Cross Cover 7pm-7am - check amion for availability Pager [phone]

## 2023-01-15 ENCOUNTER — Other Ambulatory Visit: Payer: Self-pay

## 2023-01-15 DIAGNOSIS — N390 Urinary tract infection, site not specified: Secondary | ICD-10-CM | POA: Diagnosis not present

## 2023-01-15 DIAGNOSIS — B9620 Unspecified Escherichia coli [E. coli] as the cause of diseases classified elsewhere: Secondary | ICD-10-CM

## 2023-01-15 DIAGNOSIS — A4150 Gram-negative sepsis, unspecified: Secondary | ICD-10-CM | POA: Diagnosis not present

## 2023-01-15 DIAGNOSIS — E1142 Type 2 diabetes mellitus with diabetic polyneuropathy: Secondary | ICD-10-CM | POA: Diagnosis not present

## 2023-01-15 DIAGNOSIS — E785 Hyperlipidemia, unspecified: Secondary | ICD-10-CM | POA: Diagnosis not present

## 2023-01-15 DIAGNOSIS — I10 Essential (primary) hypertension: Secondary | ICD-10-CM | POA: Diagnosis not present

## 2023-01-15 DIAGNOSIS — A499 Bacterial infection, unspecified: Secondary | ICD-10-CM

## 2023-01-15 DIAGNOSIS — Z1612 Extended spectrum beta lactamase (ESBL) resistance: Secondary | ICD-10-CM | POA: Diagnosis not present

## 2023-01-15 LAB — RESPIRATORY PANEL BY PCR

## 2023-01-15 LAB — CBC
HCT: 36.1 % (ref 36.0–46.0)
Hemoglobin: 11.4 g/dL — ABNORMAL LOW (ref 12.0–15.0)
MCH: 25.2 pg — ABNORMAL LOW (ref 26.0–34.0)
MCHC: 31.6 g/dL (ref 30.0–36.0)
MCV: 79.7 fL — ABNORMAL LOW (ref 80.0–100.0)
Platelets: 249 10*3/uL (ref 150–400)
RBC: 4.53 MIL/uL (ref 3.87–5.11)
RDW: 14.7 % (ref 11.5–15.5)
WBC: 5 10*3/uL (ref 4.0–10.5)
nRBC: 0 % (ref 0.0–0.2)

## 2023-01-15 LAB — BASIC METABOLIC PANEL
Anion gap: 12 (ref 5–15)
BUN: 11 mg/dL (ref 8–23)
CO2: 26 mmol/L (ref 22–32)
Calcium: 9 mg/dL (ref 8.9–10.3)
Chloride: 97 mmol/L — ABNORMAL LOW (ref 98–111)
Creatinine, Ser: 0.81 mg/dL (ref 0.44–1.00)
GFR, Estimated: >60 mL/min (ref >60)
Glucose, Bld: 161 mg/dL — ABNORMAL HIGH (ref 70–99)
Potassium: 3.8 mmol/L (ref 3.5–5.1)
Sodium: 135 mmol/L (ref 135–145)

## 2023-01-15 LAB — URINE CULTURE: Culture: >=100000 — AB

## 2023-01-15 LAB — MAGNESIUM: Magnesium: 2 mg/dL (ref 1.7–2.4)

## 2023-01-15 MED ORDER — SODIUM CHLORIDE 0.9 % IV SOLN
1.0000 g | INTRAVENOUS | Status: DC
  Administered 2023-01-16 – 2023-01-18 (×3): 1 g via INTRAVENOUS
  Filled 2023-01-15: qty 1000
  Filled 2023-01-15: qty 1
  Filled 2023-01-15: qty 1000

## 2023-01-15 MED ORDER — SODIUM CHLORIDE 0.9 % IV SOLN
1.0000 g | Freq: Three times a day (TID) | INTRAVENOUS | Status: AC
  Administered 2023-01-15 – 2023-01-16 (×3): 1 g via INTRAVENOUS
  Filled 2023-01-15 (×3): qty 20

## 2023-01-15 NOTE — TOC Progression Note (Signed)
Transition of Care [facility]) - Progression Note    Patient Details  Name: Alexandra Macias MRN: [id number] Date of Birth: Feb 25, 1961  Transition of Care [facility]) CM/SW Contact  Truddie Hidden, RN Phone Number: 01/15/2023, 4:27 PM  Clinical Narrative:    Spoke with patient regarding home infusions. She stated she does not have any other family to assist with her infusions. Pam with [facility] and MD notified.         Expected Discharge Plan and Services                                               Social Determinants of Health (SDOH) Interventions SDOH Screenings   Food Insecurity: No Food Insecurity (01/13/2023)  Housing: Low Risk  (01/13/2023)  Transportation Needs: No Transportation Needs (01/13/2023)  Utilities: Not At Risk (01/13/2023)  Tobacco Use: Medium Risk (01/13/2023)    Readmission Risk Interventions     No data to display

## 2023-01-15 NOTE — Care Management Important Message (Signed)
Important Message  Patient Details  Name: Alexandra Macias MRN: [id number] Date of Birth: 06-09-1960   Important Message Given:  N/A - LOS <3 / Initial given by admissions     Alexandra Macias 01/15/2023, 12:39 PM

## 2023-01-15 NOTE — TOC Progression Note (Signed)
Transition of Care [facility]) - Progression Note    Patient Details  Name: Alexandra Macias MRN: [id number] Date of Birth: May 26, 1960  Transition of Care [facility]) CM/SW Contact  Truddie Hidden, RN Phone Number: 01/15/2023, 11:52 AM  Clinical Narrative:    TOC continuing to follow patient's progress throughout discharge planning.        Expected Discharge Plan and Services                                               Social Determinants of Health (SDOH) Interventions SDOH Screenings   Food Insecurity: No Food Insecurity (01/13/2023)  Housing: Low Risk  (01/13/2023)  Transportation Needs: No Transportation Needs (01/13/2023)  Utilities: Not At Risk (01/13/2023)  Tobacco Use: Medium Risk (01/13/2023)    Readmission Risk Interventions     No data to display

## 2023-01-15 NOTE — Consult Note (Signed)
NAME: Alexandra Macias  DOB: [birth year]  MRN: [id number]  Date/Time: 01/15/2023 1:33 PM  REQUESTING PROVIDER: Dr.Patel Subjective:  REASON FOR CONSULT: ESBL ecoli UTI ? Alexandra Macias is a 62 y.o. with a history of  hypertension increased BMI, presents with 3 day h/o of generalized body pain, flu like symptoms and  had left flank pain, upon admission to the hospital, she was diagnosed with a urinary tract infection (UTI). This is her first UTI, and she denies dysuria. Despite adequate fluid intake, she was unable to urinate for approximately two days. She eventually began to urinate after consuming cranberry juice, but noted her urine was dark, which she attributed to the UTI or possible dehydration.  The patient also reports chronic fatigue, which has worsened over the past month. She denies sleep apnea and snoring. She has a history of working early morning shifts and has struggled to adjust her sleep schedule since retiring.  She also reports a history of asthma, for which she uses an as-needed inhaler, likely albuterol. She denies heart disease. Her surgical history includes  hysterectomy, wrist and foot surgeries,  She lives alone and is currently disabled. She was previously on an injectable medication  mounjaro for weight loss, but discontinued it due to side effects of sickness and constipation. In the ED vitals  01/12/23 17:06  BP 112/91 (H)  Temp 103 F (39.4 C) !  Pulse Rate 111 !  Resp 19  SpO2 92 %    Latest Reference Range & Units 01/12/23 17:19  WBC 4.0 - 10.5 K/uL 9.9  Hemoglobin 12.0 - 15.0 g/dL 40.9  HCT 81.1 - 91.4 % 42.6  Platelets 150 - 400 K/uL 252  Creatinine 0.44 - 1.00 mg/dL 7.82 (H)   Urine analysis > 50 wbc CT abdomen showed left pylelononephritis Blood culture and urine culture sent She was started on ceftriaxone   Fever persisted I am seeing her because of ESBL ecoli in urine culture  Past Medical History:  Diagnosis Date   DVT (deep venous thrombosis)  (HCC) 2005   post op hysterectomy   Hypertension     Past Surgical History:  Procedure Laterality Date   ABDOMINAL HYSTERECTOMY  2005   FOOT FUSION  2015   right-fx   HARDWARE REMOVAL Right 07/05/2014   Procedure: REMOVAL OF DEEP IMPLANTS OF RIGHT ANKLE;  Surgeon: Toni Arthurs, MD;  Location: [facility];  Service: Orthopedics;  Laterality: Right;   ORIF FOOT FRACTURE  2010   left   ORIF WRIST FRACTURE     both rt and lt    Social History   Socioeconomic History   Marital status: Single    Spouse name: Not on file   Number of children: Not on file   Years of education: Not on file   Highest education level: Not on file  Occupational History   Not on file  Tobacco Use   Smoking status: Former    Current packs/day: 0.00    Types: Cigarettes    Quit date: 12/26/2013    Years since quitting: 9.0   Smokeless tobacco: Never  Substance and Sexual Activity   Alcohol use: Yes    Comment: rare   Drug use: No   Sexual activity: Not on file  Other Topics Concern   Not on file  Social History Narrative   Not on file   Social Determinants of Health   Financial Resource Strain: Not on file  Food Insecurity: No Food Insecurity (01/13/2023)  Hunger Vital Sign    Worried About Running Out of Food in the Last Year: Never true    Ran Out of Food in the Last Year: Never true  Transportation Needs: No Transportation Needs (01/13/2023)   PRAPARE - Administrator, Civil Service (Medical): No    Lack of Transportation (Non-Medical): No  Physical Activity: Not on file  Stress: Not on file  Social Connections: Not on file  Intimate Partner Violence: Not At Risk (01/13/2023)   Humiliation, Afraid, Rape, and Kick questionnaire    Fear of Current or Ex-Partner: No    Emotionally Abused: No    Physically Abused: No    Sexually Abused: No    Family History  Problem Relation Age of Onset   Breast cancer Neg Hx    No Known Allergies I? Current  Facility-Administered Medications  Medication Dose Route Frequency Provider Last Rate Last Admin   acetaminophen (TYLENOL) tablet 650 mg  650 mg Oral Q6H PRN [name], [name], MD   650 mg at 01/14/23 6433   Or   acetaminophen (TYLENOL) suppository 650 mg  650 mg Rectal Q6H PRN [name], [name], MD       albuterol (PROVENTIL) (2.5 MG/3ML) 0.083% nebulizer solution 2.5 mg  2.5 mg Inhalation Q4H PRN [name], [name], MD       amLODipine (NORVASC) tablet 10 mg  10 mg Oral Daily Darlin Priestly, MD   10 mg at 01/15/23 0833   enoxaparin (LOVENOX) injection 82.5 mg  0.5 mg/kg Subcutaneous Q24H [name], [name], MD   82.5 mg at 01/14/23 2110   gabapentin (NEURONTIN) capsule 300 mg  300 mg Oral TID [name], [name], MD   300 mg at 01/15/23 2951   HYDROcodone-acetaminophen (NORCO/VICODIN) 5-325 MG per tablet 1 tablet  1 tablet Oral Q4H PRN [name], [name], MD       magnesium hydroxide (MILK OF MAGNESIA) suspension 30 mL  30 mL Oral Daily PRN [name], [name], MD       meropenem (MERREM) 1 g in sodium chloride 0.9 % 100 mL IVPB  1 g Intravenous Q8H Enedina Finner, MD 200 mL/hr at 01/15/23 0919 1 g at 01/15/23 0919   methocarbamol (ROBAXIN) tablet 500 mg  500 mg Oral Q6H PRN [name], [name], MD       ondansetron [facility]) tablet 4 mg  4 mg Oral Q6H PRN [name], [name], MD       Or   ondansetron [facility]) injection 4 mg  4 mg Intravenous Q6H PRN [name], [name], MD       traMADol Janean Sark) tablet 50 mg  50 mg Oral Q6H PRN [name], [name], MD   50 mg at 01/15/23 1315   traZODone (DESYREL) tablet 25 mg  25 mg Oral QHS PRN [name], [name], MD         Abtx:  Anti-infectives (From admission, onward)    Start     Dose/Rate Route Frequency Ordered Stop   01/15/23 1000  meropenem (MERREM) 1 g in sodium chloride 0.9 % 100 mL IVPB        1 g 200 mL/hr over 30 Minutes Intravenous Every 8 hours 01/15/23 0804     01/13/23 2130  cefTRIAXone (ROCEPHIN) 2 g in sodium chloride 0.9 % 100 mL IVPB  Status:  Discontinued        2 g 200 mL/hr over 30 Minutes Intravenous  Every 24 hours 01/12/23 2228 01/15/23 0801   01/12/23 2115  cefTRIAXone (ROCEPHIN) 2 g in sodium chloride 0.9 % 100 mL IVPB        2 g 200 mL/hr over 30 Minutes Intravenous  Once 01/12/23 2113 01/12/23 2158   01/12/23 2100  ceFEPIme (MAXIPIME) 2 g in sodium chloride 0.9 % 100 mL IVPB  Status:  Discontinued        2 g 200 mL/hr over 30 Minutes Intravenous  Once 01/12/23 2058 01/12/23 2113   01/12/23 2100  metroNIDAZOLE (FLAGYL) IVPB 500 mg  Status:  Discontinued        500 mg 100 mL/hr over 60 Minutes Intravenous  Once 01/12/23 2058 01/12/23 2113   01/12/23 2100  vancomycin (VANCOCIN) IVPB 1000 mg/200 mL premix  Status:  Discontinued        1,000 mg 200 mL/hr over 60 Minutes Intravenous  Once 01/12/23 2058 01/12/23 2113       REVIEW OF SYSTEMS:  Const:  fever,  chills, negative weight loss Eyes: negative diplopia or visual changes, negative eye pain ENT: negative coryza, negative sore throat Resp: negative cough, hemoptysis, dyspnea Cards: negative for chest pain, palpitations, lower extremity edema GU: negative for frequency, dysuria and hematuria GI: left flank pain difficulty passing urine  Skin: negative for rash and pruritus Heme: negative for easy bruising and gum/nose bleeding MS:  myalgias, arthralgias, back pain and muscle weakness Neurolo:+ headaches, +dizziness, no vertigo, memory problems  Psych: negative for feelings of anxiety, depression  Endocrine: negative for thyroid, diabetes Allergy/Immunology- negative for any medication or food allergies ?  Objective:  VITALS:  BP 127/74   Pulse 69   Temp 98.6 F (37 C) (Oral)   Resp 18   Ht 5\' 7"  (1.702 m)   Wt (!) 165.6 kg   SpO2 98%   BMI 57.17 kg/m   PHYSICAL EXAM:  General: Alert, cooperative, no distress, appears stated age. Increased BMI  Head: Normocephalic, without obvious abnormality, atraumatic. Eyes: Conjunctivae clear, anicteric sclerae. Pupils are equal ENT Nares normal. No drainage or sinus  tenderness. Lips, mucosa, and tongue normal. No Thrush edentulous Neck: Supple, symmetrical, no adenopathy, thyroid: non tender no carotid bruit and no JVD. Back: left  CVA tenderness. Lungs: Clear to auscultation bilaterally. No Wheezing or Rhonchi. No rales. Heart: Regular rate and rhythm, no murmur, rub or gallop. Abdomen: Soft, non-tender,not distended. Bowel sounds normal. No masses Extremities: atraumatic, no cyanosis.  No clubbing Edema legs Skin: No rashes or lesions. Or bruising Lymph: Cervical, supraclavicular normal. Neurologic: Grossly non-focal Pertinent Labs Lab Results CBC    Component Value Date/Time   WBC 5.0 01/15/2023 0646   RBC 4.53 01/15/2023 0646   HGB 11.4 (L) 01/15/2023 0646   HCT 36.1 01/15/2023 0646   PLT 249 01/15/2023 0646   MCV 79.7 (L) 01/15/2023 0646   MCH 25.2 (L) 01/15/2023 0646   MCHC 31.6 01/15/2023 0646   RDW 14.7 01/15/2023 0646   LYMPHSABS 0.9 01/12/2023 1719   MONOABS 0.7 01/12/2023 1719   EOSABS 0.0 01/12/2023 1719   BASOSABS 0.0 01/12/2023 1719       Latest Ref Rng & Units 01/15/2023    6:46 AM 01/14/2023    6:36 AM 01/13/2023   10:24 AM  CMP  Glucose 70 - 99 mg/dL 161  096  045   BUN 8 - 23 mg/dL 11  18  18    Creatinine 0.44 - 1.00 mg/dL 4.09  8.11  9.14   Sodium 135 - 145 mmol/L 135  131  133   Potassium 3.5 -  5.1 mmol/L 3.8  3.5  3.4   Chloride 98 - 111 mmol/L 97  95  98   CO2 22 - 32 mmol/L 26  25  26    Calcium 8.9 - 10.3 mg/dL 9.0  8.4  8.5       Microbiology: Recent Results (from the past 240 hour(s))  Culture, blood (Routine x 2)     Status: None (Preliminary result)   Collection Time: 01/12/23  5:19 PM   Specimen: BLOOD  Result Value Ref Range Status   Specimen Description BLOOD BLOOD LEFT HAND  Final   Special Requests   Final    BOTTLES DRAWN AEROBIC AND ANAEROBIC Blood Culture adequate volume   Culture   Final    NO GROWTH 3 DAYS Performed at [facility], [address], [place], [place]  [address]    Report Status PENDING  Incomplete  Resp panel by RT-PCR (RSV, Flu A&B, Covid) Anterior Nasal Swab     Status: None   Collection Time: 01/12/23  5:20 PM   Specimen: Anterior Nasal Swab  Result Value Ref Range Status   SARS Coronavirus 2 by RT PCR NEGATIVE NEGATIVE Final    Comment: (NOTE) SARS-CoV-2 target nucleic acids are NOT DETECTED.  The SARS-CoV-2 RNA is generally detectable in upper respiratory specimens during the acute phase of infection. The lowest concentration of SARS-CoV-2 viral copies this assay can detect is 138 copies/mL. A negative result does not preclude SARS-Cov-2 infection and should not be used as the sole basis for treatment or other patient management decisions. A negative result may occur with  improper specimen collection/handling, submission of specimen other than nasopharyngeal swab, presence of viral mutation(s) within the areas targeted by this assay, and inadequate number of viral copies(<138 copies/mL). A negative result must be combined with clinical observations, patient history, and epidemiological information. The expected result is Negative.  Fact Sheet for Patients:  BloggerCourse.com  Fact Sheet for Healthcare Providers:  SeriousBroker.it  This test is no t yet approved or cleared by the [place] FDA and  has been authorized for detection and/or diagnosis of SARS-CoV-2 by FDA under an Emergency Use Authorization (EUA). This EUA will remain  in effect (meaning this test can be used) for the duration of the COVID-19 declaration under Section 564(b)(1) of the Act, 21 U.S.C.section 360bbb-3(b)(1), unless the authorization is terminated  or revoked sooner.       Influenza A by PCR NEGATIVE NEGATIVE Final   Influenza B by PCR NEGATIVE NEGATIVE Final    Comment: (NOTE) The Xpert Xpress SARS-CoV-2/FLU/RSV plus assay is intended as an aid in the diagnosis of influenza from  Nasopharyngeal swab specimens and should not be used as a sole basis for treatment. Nasal washings and aspirates are unacceptable for Xpert Xpress SARS-CoV-2/FLU/RSV testing.  Fact Sheet for Patients: BloggerCourse.com  Fact Sheet for Healthcare Providers: SeriousBroker.it  This test is not yet approved or cleared by the [place] FDA and has been authorized for detection and/or diagnosis of SARS-CoV-2 by FDA under an Emergency Use Authorization (EUA). This EUA will remain in effect (meaning this test can be used) for the duration of the COVID-19 declaration under Section 564(b)(1) of the Act, 21 U.S.C. section 360bbb-3(b)(1), unless the authorization is terminated or revoked.     Resp Syncytial Virus by PCR NEGATIVE NEGATIVE Final    Comment: (NOTE) Fact Sheet for Patients: BloggerCourse.com  Fact Sheet for Healthcare Providers: SeriousBroker.it  This test is not yet approved or cleared by the  [place] [employer] trader and has been authorized for detection and/or diagnosis of SARS-CoV-2 by FDA under an TEFL teacher (EUA). This EUA will remain in effect (meaning this test can be used) for the duration of the COVID-19 declaration under Section 564(b)(1) of the Act, 21 U.S.C. section 360bbb-3(b)(1), unless the authorization is terminated or revoked.  Performed at [facility], [address] [place], [place] [address]   Culture, blood (Routine x 2)     Status: None (Preliminary result)   Collection Time: 01/12/23  5:23 PM   Specimen: BLOOD  Result Value Ref Range Status   Specimen Description BLOOD BLOOD RIGHT HAND  Final   Special Requests   Final    BOTTLES DRAWN AEROBIC AND ANAEROBIC Blood Culture adequate volume   Culture   Final    NO GROWTH 3 DAYS Performed at [facility], [address], [place], [place] [address]    Report Status  PENDING  Incomplete  Urine Culture     Status: Abnormal   Collection Time: 01/12/23  8:56 PM   Specimen: Urine, Clean Catch  Result Value Ref Range Status   Specimen Description   Final    URINE, CLEAN CATCH Performed at [facility], [address] [place], [place] [address]    Special Requests   Final    NONE Performed at [facility], [address] [place], [place] [address]    Culture (A)  Final    >=100,000 COLONIES/mL ESCHERICHIA COLI Confirmed Extended Spectrum Beta-Lactamase Producer (ESBL).  In bloodstream infections from ESBL organisms, carbapenems are preferred over piperacillin/tazobactam. They are shown to have a lower risk of mortality.    Report Status 01/15/2023 FINAL  Final   Organism ID, Bacteria ESCHERICHIA COLI (A)  Final      Susceptibility   Escherichia coli - MIC*    AMPICILLIN >=32 RESISTANT Resistant     CEFAZOLIN >=64 RESISTANT Resistant     CEFEPIME 16 RESISTANT Resistant     CEFTRIAXONE >=64 RESISTANT Resistant     CIPROFLOXACIN >=4 RESISTANT Resistant     GENTAMICIN <=1 SENSITIVE Sensitive     IMIPENEM <=0.25 SENSITIVE Sensitive     NITROFURANTOIN <=16 SENSITIVE Sensitive     TRIMETH/SULFA >=320 RESISTANT Resistant     AMPICILLIN/SULBACTAM 16 INTERMEDIATE Intermediate     PIP/TAZO 8 SENSITIVE Sensitive ug/mL    * >=100,000 COLONIES/mL ESCHERICHIA COLI  Respiratory (~20 pathogens) panel by PCR     Status: None   Collection Time: 01/14/23  2:21 PM   Specimen: Nasopharyngeal Swab; Respiratory  Result Value Ref Range Status   Adenovirus NOT DETECTED NOT DETECTED Final   Coronavirus 229E NOT DETECTED NOT DETECTED Final    Comment: (NOTE) The Coronavirus on the Respiratory Panel, DOES NOT test for the novel  Coronavirus (2019 nCoV)    Coronavirus HKU1 NOT DETECTED NOT DETECTED Final   Coronavirus NL63 NOT DETECTED NOT DETECTED Final   Coronavirus OC43 NOT DETECTED NOT DETECTED Final   Metapneumovirus NOT DETECTED NOT DETECTED  Final   Rhinovirus / Enterovirus NOT DETECTED NOT DETECTED Final   Influenza A NOT DETECTED NOT DETECTED Final   Influenza B NOT DETECTED NOT DETECTED Final   Parainfluenza Virus 1 NOT DETECTED NOT DETECTED Final   Parainfluenza Virus 2 NOT DETECTED NOT DETECTED Final   Parainfluenza Virus 3 NOT DETECTED NOT DETECTED Final   Parainfluenza Virus 4 NOT DETECTED NOT DETECTED Final   Respiratory Syncytial Virus NOT DETECTED NOT DETECTED Final  Bordetella pertussis NOT DETECTED NOT DETECTED Final   Bordetella Parapertussis NOT DETECTED NOT DETECTED Final   Chlamydophila pneumoniae NOT DETECTED NOT DETECTED Final   Mycoplasma pneumoniae NOT DETECTED NOT DETECTED Final    Comment: Performed at [facility], [address], [place], [place] [address]    IMAGING RESULTS:  I have personally reviewed the films Left perinephric inflammatory stranding ? Impression/Recommendation Urinary Tract Infection (ESBL E. Coli) Presented with generalized body pain, dark urine, and decreased urination. Urine culture revealed ESBL E. Coli resistant to initial antibiotics. -Changed ceftriaxone to Meropenem. -Plan for a minimum of 7 days of treatment. -Consider home administration or daily visits to a day surgery center for antibiotic administration, depending on patient's comfort and ability.  Chronic Conditions History of hypertension, managed with Amlodipine and Lisinopril.   Asthma managed with as-needed Albuterol inhaler.  -Continue current management.  General Health Reports fatigue and excessive sleepiness for the past month. -Recommend evaluation for sleep apnea upon discharge.? Discussed with patient, requesting provider  ID will follow peripherally this weekend- call if needed?   ?OPAT Diagnosis: ESBL ecoli Pyelonephritis Baseline Creatinine <1   No Known Allergies  OPAT Orders Discharge antibiotics: Ertapenem 1 gram IV every 24 hours for 7-10  days End  Date:01/22/23   [facility] Care Per Protocol:  Labs weekly while on IV antibiotics: _X_ CBC with differential  _X_ CMP   _X_ Please pull PIC at completion of IV antibiotics   Fax weekly lab results  promptly to [phone]  Clinic Follow Up Appt:none    Call [phone] with critical value or questions    ________________________________________________  Note:  This document was prepared using Dragon voice recognition software and may include unintentional dictation errors.

## 2023-01-15 NOTE — Plan of Care (Signed)
°  Problem: Fluid Volume: °Goal: Hemodynamic stability will improve °Outcome: Progressing °  °Problem: Clinical Measurements: °Goal: Diagnostic test results will improve °Outcome: Progressing °Goal: Signs and symptoms of infection will decrease °Outcome: Progressing °  °

## 2023-01-15 NOTE — Progress Notes (Signed)
  PROGRESS NOTE    CONGETTA ODRISCOLL  ZOX:[id number] DOB: June 17, 1960 DOA: 01/12/2023 PCP: Debbra Riding Hestle, PA-C  225A/225A-AA  LOS: 3 days   Brief hospital course:   Assessment & Plan: MEREDITH KILBRIDE is a 62 y.o. African-American female with medical history significant for essential hypertension and DVT, who presented to the emergency room with acute onset of lower abdominal pain.    Sepsis  --Sepsis manifested by fever, tachycardia and tachypnea.  Without dysuria and such recurrent high fever, seems unlikely sepsis is just from UTI. --CT a/p showed possible left pyelo, no colonic inflammatory change. --RVP--negative  ESBL UTI with left pyelonephritis noted on CT abdomen --started on ceftriaxone on admission, however, continues to have fevers, left flank pain -- Urine culture shows ESBL E. coli. Started on IV meropenem _-ID consult to see pt  LLQ abdominal pain --pt had a CT a/p on 12/24/22 for the same pain, and showed colitis and extensive diverticulosis.   --current CT a/p showed "Colonic diverticulosis without superimposed acute inflammatory change."  Essential hypertension - amlodipine and Lisinopril held due to intermittent low BP --resume home amlodipine today  Type 2 diabetes mellitus with peripheral neuropathy (HCC) - no need for BG checks and SSI - will continue Neurontin.  Dyslipidemia - will continue statin therapy.  Hypokalemia --monitor and supplement PRN  4.0 cm left adrenal mass  --consider surgical consult  Mild hyponatremia --unclear significance   Morbid obesity with BMI of 57.17 -- diet weight loss and exercise discussed with patient   DVT prophylaxis: Lovenox SQ Code Status: Full code  Family Communication:  Level of care: Telemetry Medical Dispo:   The patient is from: home Anticipated d/c is to: home Anticipated d/c date is: 2-3 days   Subjective and Interval History:  Continued to have fevers.  Pt complained of LLQ pain, left back  pain.  Reported very little urine output.  Bladder scan performed, no retention.   Objective: Vitals:   01/14/23 1704 01/14/23 2122 01/15/23 0422 01/15/23 0832  BP: (!) 156/74 131/67 124/70 127/74  Pulse: 79 74 72 69  Resp: 20 18 18 18   Temp: 99 F (37.2 C) 98.5 F (36.9 C) 98.4 F (36.9 C) 98.6 F (37 C)  TempSrc: Oral Oral Oral Oral  SpO2: 96% 97% 97% 98%  Weight:      Height:        Intake/Output Summary (Last 24 hours) at 01/15/2023 1428 Last data filed at 01/15/2023 1309 Gross per 24 hour  Intake 1081.67 ml  Output --  Net 1081.67 ml   Filed Weights   01/12/23 1717  Weight: (!) 165.6 kg    Examination:   Constitutional: NAD, AAOx3 morbidly obese HEENT: conjunctivae and lids normal, EOMI CV: No cyanosis.   RESP: normal respiratory effort, on RA Neuro:  grossly intact.   Psych: depressed mood and affect.  Appropriate judgement and reason   Time spent: 35 minutes  Enedina Finner, MD [facility] 01/15/2023, 2:28 PM

## 2023-01-16 DIAGNOSIS — A4150 Gram-negative sepsis, unspecified: Secondary | ICD-10-CM | POA: Diagnosis not present

## 2023-01-16 DIAGNOSIS — N390 Urinary tract infection, site not specified: Secondary | ICD-10-CM | POA: Diagnosis not present

## 2023-01-16 LAB — GLUCOSE, CAPILLARY: Glucose-Capillary: 130 mg/dL — ABNORMAL HIGH (ref 70–99)

## 2023-01-16 NOTE — Progress Notes (Signed)
  PROGRESS NOTE    Alexandra Macias  ZOX:[id number] DOB: [birth year] DOA: 01/12/2023 PCP: Debbra Riding Hestle, PA-C  225A/225A-AA  LOS: 4 days   Brief hospital course:   Assessment & Plan: Alexandra Macias is a 62 y.o. African-American female with medical history significant for essential hypertension and DVT, who presented to the emergency room with acute onset of lower abdominal pain.    Sepsis  --Sepsis manifested by fever, tachycardia and tachypnea.  Source UTI.  ESBL E coli UTI with left pyelonephritis noted on CT abdomen --started on ceftriaxone on admission, however, continued to have fevers, left flank pain.  Urine culture shows ESBL E. coli. Abx switched to IV meropenem with resolution of fevers. --ID consulted Plan: --switch to Ertapenem, will need 7 days of tx --plan for abx administration in infusion center, per pt preference  LLQ abdominal pain --pt had a CT a/p on 12/24/22 for the same pain, and showed colitis and extensive diverticulosis.   --current CT a/p showed "Colonic diverticulosis without superimposed acute inflammatory change."  Essential hypertension --cont amlodipine  Type 2 diabetes mellitus with peripheral neuropathy (HCC) - no need for BG checks and SSI --cont gabapentin  Dyslipidemia - will continue statin therapy.  Hypokalemia --monitor and supplement PRN  4.0 cm left adrenal mass  --consider surgical consult  Mild hyponatremia --unclear significance   Morbid obesity with BMI of 57.17 -- diet weight loss and exercise discussed with patient   DVT prophylaxis: Lovenox SQ Code Status: Full code  Family Communication:  Level of care: Telemetry Medical Dispo:   The patient is from: home Anticipated d/c is to: home Anticipated d/c date is: Monday   Subjective and Interval History:  Pt reported feeling better, but still had some left back pain.   Objective: Vitals:   01/16/23 0115 01/16/23 0420 01/16/23 0810 01/16/23 1547  BP: 125/82  (!) 142/78 124/74 (!) 142/69  Pulse: 74 73 72 72  Resp: 16 20 18 18   Temp: 98.4 F (36.9 C) 98.8 F (37.1 C) 98.4 F (36.9 C) 98 F (36.7 C)  TempSrc: Oral Oral Oral Oral  SpO2: 99% 100% 95% 94%  Weight:      Height:        Intake/Output Summary (Last 24 hours) at 01/16/2023 1707 Last data filed at 01/16/2023 1600 Gross per 24 hour  Intake 920 ml  Output --  Net 920 ml   Filed Weights   01/12/23 1717  Weight: (!) 165.6 kg    Examination:   Constitutional: NAD, AAOx3 HEENT: conjunctivae and lids normal, EOMI CV: No cyanosis.   RESP: normal respiratory effort, on RA Neuro: II - XII grossly intact.   Psych: Normal mood and affect.  Appropriate judgement and reason   Time spent: 35 minutes  Darlin Priestly, MD [facility] 01/16/2023, 5:07 PM

## 2023-01-17 DIAGNOSIS — A4150 Gram-negative sepsis, unspecified: Secondary | ICD-10-CM | POA: Diagnosis not present

## 2023-01-17 DIAGNOSIS — N390 Urinary tract infection, site not specified: Secondary | ICD-10-CM | POA: Diagnosis not present

## 2023-01-17 LAB — CULTURE, BLOOD (ROUTINE X 2)
Culture: NO GROWTH
Culture: NO GROWTH
Special Requests: ADEQUATE
Special Requests: ADEQUATE

## 2023-01-17 LAB — GLUCOSE, CAPILLARY: Glucose-Capillary: 165 mg/dL — ABNORMAL HIGH (ref 70–99)

## 2023-01-17 NOTE — TOC Progression Note (Signed)
Transition of Care [facility]) - Progression Note    Patient Details  Name: Alexandra Macias MRN: [id number] Date of Birth: 1961/04/02  [facility]) CM/SW Contact  Kemper Durie, RN Phone Number: 01/17/2023, 3:43 PM  Clinical Narrative:     Per MD, patient would like to go to infusion center for IV antibiotic treatments.  TOC team will continue to follow and assist with discharge planning.       Expected Discharge Plan and Services                                               Social Determinants of Health (SDOH) Interventions SDOH Screenings   Food Insecurity: No Food Insecurity (01/13/2023)  Housing: Low Risk  (01/13/2023)  Transportation Needs: No Transportation Needs (01/13/2023)  Utilities: Not At Risk (01/13/2023)  Tobacco Use: Medium Risk (01/13/2023)    Readmission Risk Interventions     No data to display

## 2023-01-17 NOTE — Progress Notes (Signed)
  PROGRESS NOTE    Alexandra Macias  GNF:[id number] DOB: 22-Jun-1960 DOA: 01/12/2023 PCP: Debbra Riding Hestle, PA-C  225A/225A-AA  LOS: 5 days   Brief hospital course:   Assessment & Plan: Alexandra Macias is a 62 y.o. African-American female with medical history significant for essential hypertension and DVT, who presented to the emergency room with acute onset of lower abdominal pain.    Sepsis  --Sepsis manifested by fever, tachycardia and tachypnea.  Source UTI.  ESBL E coli UTI with left pyelonephritis noted on CT abdomen --started on ceftriaxone on admission, however, continued to have fevers, left flank pain.  Urine culture shows ESBL E. coli. Abx switched to IV meropenem with resolution of fevers. --ID consulted Plan: --cont Ertapenem, will need 7 days total, per ID --plan for abx administration in infusion center, per pt preference  LLQ abdominal pain --pt had a CT a/p on 12/24/22 for the same pain, and showed colitis and extensive diverticulosis.   --current CT a/p showed "Colonic diverticulosis without superimposed acute inflammatory change."  Essential hypertension --cont amlodipine  Type 2 diabetes mellitus with peripheral neuropathy (HCC) - no need for BG checks and SSI --cont gabapentin  Dyslipidemia --resume statin after discharge  Hypokalemia --monitor and supplement PRN  4.0 cm left adrenal mass  --consider surgical consult  Mild hyponatremia --unclear significance   Morbid obesity with BMI of 57.17 -- diet weight loss and exercise discussed with patient   DVT prophylaxis: Lovenox SQ Code Status: Full code  Family Communication:  Level of care: Telemetry Medical Dispo:   The patient is from: home Anticipated d/c is to: home Anticipated d/c date is: Monday   Subjective and Interval History:  Pt reported feeling better.  Back pain improved with muscle relaxant.  Had BM.     Objective: Vitals:   01/16/23 1547 01/16/23 2116 01/17/23 0423  01/17/23 0815  BP: (!) 142/69 (!) 143/42  (!) 144/56  Pulse: 72 71 80 79  Resp: 18 20 20 20   Temp: 98 F (36.7 C) 98.2 F (36.8 C) 97.8 F (36.6 C) 98 F (36.7 C)  TempSrc: Oral Oral Oral Oral  SpO2: 94% 98% 96% 96%  Weight:      Height:        Intake/Output Summary (Last 24 hours) at 01/17/2023 1530 Last data filed at 01/17/2023 1029 Gross per 24 hour  Intake 580 ml  Output --  Net 580 ml   Filed Weights   01/12/23 1717  Weight: (!) 165.6 kg    Examination:   Constitutional: NAD, AAOx3 HEENT: conjunctivae and lids normal, EOMI CV: No cyanosis.   RESP: normal respiratory effort, on RA Neuro: II - XII grossly intact.   Psych: Normal mood and affect.  Appropriate judgement and reason   Time spent: 35 minutes  Darlin Priestly, MD [facility] 01/17/2023, 3:30 PM

## 2023-01-18 DIAGNOSIS — N390 Urinary tract infection, site not specified: Secondary | ICD-10-CM | POA: Diagnosis not present

## 2023-01-18 DIAGNOSIS — A4150 Gram-negative sepsis, unspecified: Secondary | ICD-10-CM | POA: Diagnosis not present

## 2023-01-18 MED ORDER — GABAPENTIN 300 MG PO CAPS
300.0000 mg | ORAL_CAPSULE | Freq: Three times a day (TID) | ORAL | Status: AC
Start: 2023-01-18 — End: ?

## 2023-01-18 MED ORDER — METHOCARBAMOL 500 MG PO TABS: ORAL_TABLET | ORAL | 0 refills | Status: DC

## 2023-01-18 NOTE — TOC Transition Note (Signed)
Transition of Care [facility]) - CM/SW Discharge Note   Patient Details  Name: Alexandra Macias MRN: [id number] Date of Birth: 04/03/1961  Transition of Care [facility]) CM/SW Contact:  Margarito Liner, LCSW Phone Number: 01/18/2023, 10:08 AM   Clinical Narrative:  Patient has orders to discharge home today. Infusion center called patient to schedule visits. No further concerns. CSW signing off.   Final next level of care: Home/Self Care Barriers to Discharge: Barriers Resolved   Patient Goals and CMS Choice      Discharge Placement                      Patient and family notified of of transfer: 01/18/23  Discharge Plan and Services Additional resources added to the After Visit Summary for                                       Social Determinants of Health (SDOH) Interventions SDOH Screenings   Food Insecurity: No Food Insecurity (01/13/2023)  Housing: Low Risk  (01/13/2023)  Transportation Needs: No Transportation Needs (01/13/2023)  Utilities: Not At Risk (01/13/2023)  Tobacco Use: Medium Risk (01/13/2023)     Readmission Risk Interventions     No data to display

## 2023-01-18 NOTE — Plan of Care (Signed)

## 2023-01-18 NOTE — Discharge Summary (Signed)
Physician Discharge Summary   Alexandra Macias  female DOB: [birth year]  WUJ:[id number]  PCP: Wilford Corner, PA-C  Admit date: 01/12/2023 Discharge date: 01/18/2023  Admitted From: home Disposition:  home CODE STATUS: Full code  Discharge Instructions     Discharge instructions   Complete by: As directed    You have received 4 days of IV antibiotic for your UTI.  Please go to infusion center on Tue, Wed and Thursday to receive IV Ertapenem to complete the 7-day course. [facility] Course:  For full details, please see H&P, progress notes, consult notes and ancillary notes.  Briefly,  Alexandra Macias is a 62 y.o. African-American female with medical history significant for essential hypertension and DVT, who presented to the emergency room with acute onset of lower abdominal pain.    Sepsis  --Sepsis manifested by fever, tachycardia and tachypnea.  Source UTI.   ESBL E coli UTI with left pyelonephritis noted on CT abdomen --started on ceftriaxone on admission, however, continued to have fevers, left flank pain.  Urine culture shows ESBL E. coli. Abx switched to IV meropenem with resolution of fevers. --ID consulted.  Pt was then switched to Ertapenem.  Pt received 4 days of appropriate abx and will complete 3 more days of Ertapenem in infusion center to complete 7-day course.  LLQ abdominal pain --pt had a CT a/p on 12/24/22 for the same pain, and showed colitis and extensive diverticulosis.   --current CT a/p showed "Colonic diverticulosis without superimposed acute inflammatory change."   --Pain improved prior to discharge.   Essential hypertension --cont amlodipine   Type 2 diabetes mellitus with peripheral neuropathy (HCC) - no need for BG checks and SSI --cont gabapentin --resume home Semaglutide after discharge.   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   --further workup per PCP   Mild hyponatremia --unclear significance    Morbid obesity with BMI of 57.17   Unless noted above, medications under "STOP" list are ones pt was not taking PTA.  Discharge Diagnoses:  Principal Problem:   Sepsis due to gram-negative UTI [facility]) Active Problems:   Essential hypertension   Dyslipidemia   Type 2 diabetes mellitus with peripheral neuropathy (HCC)   ESBL (extended spectrum beta-lactamase) producing bacteria infection   30 Day Unplanned Readmission Risk Score    Flowsheet Row ED to Hosp-Admission (Current) from 01/12/2023 in [facility]  30 Day Unplanned Readmission Risk Score (%) 9.18 Filed at 01/18/2023 0801       This score is the patient's risk of an unplanned readmission within 30 days of being discharged (0 -100%). The score is based on dignosis, age, lab data, medications, orders, and past utilization.   Low:  0-14.9   Medium: 15-21.9   High: 22-29.9   Extreme: 30 and above         Discharge Instructions:  Allergies as of 01/18/2023   No Known Allergies      Medication List     STOP taking these medications    etodolac 400 MG tablet Commonly known as: LODINE   HYDROcodone-acetaminophen 5-325 MG tablet Commonly known as: NORCO/VICODIN   methylPREDNISolone 4 MG Tbpk tablet Commonly known as: MEDROL DOSEPAK   oxyCODONE 5 MG immediate release tablet Commonly known as: Oxy IR/ROXICODONE   traMADol 50 MG tablet Commonly known  as: Ultram       TAKE these medications    albuterol 108 (90 Base) MCG/ACT inhaler Commonly known as: VENTOLIN HFA Inhale 2 puffs into the lungs every 4 (four) hours as needed for wheezing or shortness of breath.   amLODipine 10 MG tablet Commonly known as: NORVASC Take 10 mg by mouth daily.   gabapentin 300 MG capsule Commonly known as: NEURONTIN Take 1 capsule (300 mg total) by mouth 3 (three) times daily. Home  med. What changed:  how much to take how to take this when to take this additional instructions   lisinopril 5 MG tablet Commonly known as: ZESTRIL Take 5 mg by mouth daily.   methocarbamol 500 MG tablet Commonly known as: ROBAXIN 1-2 tablets every 6 hours prn muscle spasms   montelukast 10 MG tablet Commonly known as: SINGULAIR Take 10 mg by mouth at bedtime.   rosuvastatin 40 MG tablet Commonly known as: CRESTOR Take 40 mg by mouth daily.   Rybelsus 3 MG Tabs Generic drug: Semaglutide Take 3 mg by mouth daily.         Follow-up Information     Debbra Riding Hestle, PA-C Follow up in 1 week(s).   Specialty: Family Medicine Contact information: [address] [place] [address] [phone]                 No Known Allergies   The results of significant diagnostics from this hospitalization (including imaging, microbiology, ancillary and laboratory) are listed below for reference.   Consultations:   Procedures/Studies: [place] EKG SITE RITE  Result Date: 01/15/2023 If Site Rite image not attached, placement could not be confirmed due to current cardiac rhythm.  CT ABDOMEN PELVIS W CONTRAST  Result Date: 01/13/2023 CLINICAL DATA:  Left lower quadrant abdominal pain, sepsis EXAM: CT ABDOMEN AND PELVIS WITH CONTRAST TECHNIQUE: Multidetector CT imaging of the abdomen and pelvis was performed using the standard protocol following bolus administration of intravenous contrast. RADIATION DOSE REDUCTION: This exam was performed according to the departmental dose-optimization program which includes automated exposure control, adjustment of the mA and/or kV according to patient size and/or use of iterative reconstruction technique. CONTRAST:  OMNIPAQUE IOHEXOL 350 MG/ML SOLN COMPARISON:  None Available. FINDINGS: Lower chest: No acute abnormality. Hepatobiliary: No focal liver abnormality is seen. No gallstones, gallbladder wall thickening, or  biliary dilatation. Pancreas: Unremarkable Spleen: Unremarkable Adrenals/Urinary Tract: The right adrenal gland is unremarkable. Stable 3.1 x 4.0 cm mass within the left adrenal gland, indeterminate. The kidneys are normal in size and position. Since the prior examination, there has developed mild asymmetric left perinephric inflammatory stranding suggesting a unilateral inflammatory process, most commonly pyelonephritis. There is normal cortical enhancement of the left kidney. No hydronephrosis. No intrarenal or ureteral calculi. The bladder is unremarkable. Stomach/Bowel: Severe descending and sigmoid colonic diverticulosis. Moderate to severe diverticulosis of the ascending colon. The stomach, small bowel, and large bowel are otherwise unremarkable and there is no evidence of obstruction or focal inflammation. The appendix is normal. No free intraperitoneal gas or fluid. Vascular/Lymphatic: No significant vascular findings are present. No enlarged abdominal or pelvic lymph nodes. Reproductive: Status post hysterectomy. No adnexal masses. Other: Small fat containing umbilical hernia Musculoskeletal: No acute or significant osseous findings. IMPRESSION: 1. Interval development of mild asymmetric left perinephric inflammatory stranding suggesting a unilateral inflammatory process, most commonly pyelonephritis. Correlation with urinalysis and urine culture may be helpful for further evaluation. 2. Colonic diverticulosis without superimposed acute inflammatory change. 3. Stable 4.0  cm left adrenal mass, indeterminate. Given its size, surgical consultation is advised. Laboratory correlation, including serum metanephrines may be helpful for further management. If indicated, could be better assessed with dedicated adrenal mass protocol CT or MRI examination once the patient's acute issues have resolved. Electronically Signed   By: Helyn Numbers M.D.   On: 01/13/2023 22:55   DG Chest 1 View  Result Date:  01/12/2023 CLINICAL DATA:  Sepsis, pain, weakness. EXAM: CHEST  1 VIEW COMPARISON:  04/23/2017 FINDINGS: The heart size and mediastinal contours are within normal limits. There is no evidence of pulmonary edema, consolidation, pneumothorax or pleural fluid. The visualized skeletal structures are unremarkable. IMPRESSION: No active disease. Electronically Signed   By: Irish Lack M.D.   On: 01/12/2023 18:33   CT ABDOMEN PELVIS W CONTRAST  Result Date: 12/24/2022 CLINICAL DATA:  Left lower quadrant abdominal pain which began today. Some nausea and vomiting EXAM: CT ABDOMEN AND PELVIS WITH CONTRAST TECHNIQUE: Multidetector CT imaging of the abdomen and pelvis was performed using the standard protocol following bolus administration of intravenous contrast. RADIATION DOSE REDUCTION: This exam was performed according to the departmental dose-optimization program which includes automated exposure control, adjustment of the mA and/or kV according to patient size and/or use of iterative reconstruction technique. CONTRAST:  OMNIPAQUE IOHEXOL 350 MG/ML SOLN COMPARISON:  None Available. FINDINGS: Lower chest: There is some linear opacity in the left lung base likely scar or atelectasis. Breathing motion at the bases. No pleural effusion. Hepatobiliary: Fatty liver infiltration. No space-occupying liver lesion. Gallbladder is nondilated. Patent portal vein. Pancreas: There is moderate atrophy of the pancreas. No obvious mass. Some sparing of the tail. Spleen: Normal in size without focal abnormality. Adrenals/Urinary Tract: Right adrenal gland is preserved. There is a left adrenal nodule measuring 3.7 by 3.2 cm. Lesion is slightly heterogeneous. Not clearly an adenoma on this examination. Please correlate with any prior or dedicated workup when appropriate no enhancing renal mass or collecting system dilatation. Posterior lower pole right-sided simple appearing Bosniak 1 cyst identified measuring 16 mm. No imaging  follow-up. The ureters have normal course and caliber extending down to the urinary bladder. Preserved contour to the urinary bladder. Stomach/Bowel: On this non oral contrast exam, the large bowel demonstrates diffuse colonic stool and colonic diverticulosis with a normal appendix in the right lower quadrant. However there is a moderately long segment of wall thickening along the distal transverse colon, splenic flexure and proximal and mid descending colon with inflammatory stranding. Please correlate for a segmental colitis. No extraluminal air or fluid collection. Stomach is relatively decompressed. Small bowel is nondilated. Vascular/Lymphatic: Aortic atherosclerosis. No enlarged abdominal or pelvic lymph nodes. Reproductive: Uterus is absent.  Residual ovaries. Other: Small fat containing umbilical hernia. No free air or fluid collection. Musculoskeletal: Moderate degenerative changes of the spine with multilevel stenosis in the lumbar region particularly L3-4 and L4-5. IMPRESSION: Moderately along segment of wall thickening and stranding along the distal transverse to mid descending colon consistent with a colitis. There are additional extensive areas of colonic diverticulosis. No obstruction, free air or free fluid. Normal appendix. Left adrenal nodule identified measuring 3.7 cm. Legrand Rams this being an adenoma but this has not definitive for such. Please correlate with the remote prior or dedicated workup when appropriate such as MRI or dedicated washout CT Electronically Signed   By: Karen Kays M.D.   On: 12/24/2022 16:08   US Venous Img Lower Unilateral Right  Result Date: 12/22/2022 CLINICAL DATA:  Right  lower extremity edema for 2 days,, pain, history of DVT EXAM: RIGHT LOWER EXTREMITY VENOUS DOPPLER ULTRASOUND TECHNIQUE: Gray-scale sonography with compression, as well as color and duplex ultrasound, were performed to evaluate the deep venous system(s) from the level of the common femoral vein  through the popliteal and proximal calf veins. COMPARISON:  None Available. FINDINGS: VENOUS Normal compressibility of the common femoral, superficial femoral, and popliteal veins, as well as the visualized calf veins. Visualized portions of profunda femoral vein and great saphenous vein unremarkable. No filling defects to suggest DVT on grayscale or color Doppler imaging. Doppler waveforms show normal direction of venous flow, normal respiratory plasticity and response to augmentation. Limited views of the contralateral common femoral vein are unremarkable. OTHER Baker cyst right popliteal fossa measuring 3.4 x 1.1 x 1.5 cm. Limitations: Limited due to patient body habitus. IMPRESSION: 1. No evidence of deep venous thrombosis within the right lower extremity. 2. Small right Baker's cyst. Electronically Signed   By: Sharlet Salina M.D.   On: 12/22/2022 22:54      Labs: BNP (last 3 results) No results for input(s): "BNP" in the last 8760 hours. Basic Metabolic Panel: Recent Labs  Lab 01/12/23 1719 01/13/23 1024 01/14/23 0636 01/15/23 0646  NA 135 133* 131* 135  K 3.8 3.4* 3.5 3.8  CL 98 98 95* 97*  CO2 24 26 25 26   GLUCOSE 162* 158* 122* 161*  BUN 13 18 18 11   CREATININE 1.10* 1.10* 1.06* 0.81  CALCIUM 9.0 8.5* 8.4* 9.0  MG  --   --  1.9 2.0   Liver Function Tests: Recent Labs  Lab 01/12/23 1719  AST 67*  ALT 54*  ALKPHOS 82  BILITOT 1.6*  PROT 7.9  ALBUMIN 3.6   No results for input(s): "LIPASE", "AMYLASE" in the last 168 hours. No results for input(s): "AMMONIA" in the last 168 hours. CBC: Recent Labs  Lab 01/12/23 1719 01/13/23 1024 01/14/23 0636 01/15/23 0646  WBC 9.9 9.4 6.4 5.0  NEUTROABS 8.2*  --   --   --   HGB 13.6 11.8* 10.9* 11.4*  HCT 42.6 36.4 34.7* 36.1  MCV 79.0* 78.1* 78.9* 79.7*  PLT 252 219 215 249   Cardiac Enzymes: No results for input(s): "CKTOTAL", "CKMB", "CKMBINDEX", "TROPONINI" in the last 168 hours. BNP: Invalid input(s):  "POCBNP" CBG: Recent Labs  Lab 01/16/23 0420 01/17/23 2113  GLUCAP 130* 165*   D-Dimer No results for input(s): "DDIMER" in the last 72 hours. Hgb A1c No results for input(s): "HGBA1C" in the last 72 hours. Lipid Profile No results for input(s): "CHOL", "HDL", "LDLCALC", "TRIG", "CHOLHDL", "LDLDIRECT" in the last 72 hours. Thyroid function studies No results for input(s): "TSH", "T4TOTAL", "T3FREE", "THYROIDAB" in the last 72 hours.  Invalid input(s): "FREET3" Anemia work up No results for input(s): "VITAMINB12", "FOLATE", "FERRITIN", "TIBC", "IRON", "RETICCTPCT" in the last 72 hours. Urinalysis    Component Value Date/Time   COLORURINE AMBER (A) 01/12/2023 2057   APPEARANCEUR CLOUDY (A) 01/12/2023 2057   LABSPEC 1.023 01/12/2023 2057   PHURINE 5.0 01/12/2023 2057   GLUCOSEU NEGATIVE 01/12/2023 2057   HGBUR MODERATE (A) 01/12/2023 2057   BILIRUBINUR NEGATIVE 01/12/2023 2057   KETONESUR NEGATIVE 01/12/2023 2057   PROTEINUR 100 (A) 01/12/2023 2057   NITRITE NEGATIVE 01/12/2023 2057   LEUKOCYTESUR MODERATE (A) 01/12/2023 2057   Sepsis Labs Recent Labs  Lab 01/12/23 1719 01/13/23 1024 01/14/23 0636 01/15/23 0646  WBC 9.9 9.4 6.4 5.0   Microbiology Recent Results (from the past  240 hour(s))  Culture, blood (Routine x 2)     Status: None   Collection Time: 01/12/23  5:19 PM   Specimen: BLOOD  Result Value Ref Range Status   Specimen Description BLOOD BLOOD LEFT HAND  Final   Special Requests   Final    BOTTLES DRAWN AEROBIC AND ANAEROBIC Blood Culture adequate volume   Culture   Final    NO GROWTH 5 DAYS Performed at [facility], [address] [place], [place] [address]    Report Status 01/17/2023 FINAL  Final  Resp panel by RT-PCR (RSV, Flu A&B, Covid) Anterior Nasal Swab     Status: None   Collection Time: 01/12/23  5:20 PM   Specimen: Anterior Nasal Swab  Result Value Ref Range Status   SARS Coronavirus 2 by RT PCR NEGATIVE NEGATIVE Final     Comment: (NOTE) SARS-CoV-2 target nucleic acids are NOT DETECTED.  The SARS-CoV-2 RNA is generally detectable in upper respiratory specimens during the acute phase of infection. The lowest concentration of SARS-CoV-2 viral copies this assay can detect is 138 copies/mL. A negative result does not preclude SARS-Cov-2 infection and should not be used as the sole basis for treatment or other patient management decisions. A negative result may occur with  improper specimen collection/handling, submission of specimen other than nasopharyngeal swab, presence of viral mutation(s) within the areas targeted by this assay, and inadequate number of viral copies(<138 copies/mL). A negative result must be combined with clinical observations, patient history, and epidemiological information. The expected result is Negative.  Fact Sheet for Patients:  BloggerCourse.com  Fact Sheet for Healthcare Providers:  SeriousBroker.it  This test is no t yet approved or cleared by the [place] FDA and  has been authorized for detection and/or diagnosis of SARS-CoV-2 by FDA under an Emergency Use Authorization (EUA). This EUA will remain  in effect (meaning this test can be used) for the duration of the COVID-19 declaration under Section 564(b)(1) of the Act, 21 U.S.C.section 360bbb-3(b)(1), unless the authorization is terminated  or revoked sooner.       Influenza A by PCR NEGATIVE NEGATIVE Final   Influenza B by PCR NEGATIVE NEGATIVE Final    Comment: (NOTE) The Xpert Xpress SARS-CoV-2/FLU/RSV plus assay is intended as an aid in the diagnosis of influenza from Nasopharyngeal swab specimens and should not be used as a sole basis for treatment. Nasal washings and aspirates are unacceptable for Xpert Xpress SARS-CoV-2/FLU/RSV testing.  Fact Sheet for Patients: BloggerCourse.com  Fact Sheet for Healthcare  Providers: SeriousBroker.it  This test is not yet approved or cleared by the [place] FDA and has been authorized for detection and/or diagnosis of SARS-CoV-2 by FDA under an Emergency Use Authorization (EUA). This EUA will remain in effect (meaning this test can be used) for the duration of the COVID-19 declaration under Section 564(b)(1) of the Act, 21 U.S.C. section 360bbb-3(b)(1), unless the authorization is terminated or revoked.     Resp Syncytial Virus by PCR NEGATIVE NEGATIVE Final    Comment: (NOTE) Fact Sheet for Patients: BloggerCourse.com  Fact Sheet for Healthcare Providers: SeriousBroker.it  This test is not yet approved or cleared by the [place] FDA and has been authorized for detection and/or diagnosis of SARS-CoV-2 by FDA under an Emergency Use Authorization (EUA). This EUA will remain in effect (meaning this test can be used) for the duration of the COVID-19 declaration under Section 564(b)(1) of the Act, 21 U.S.C. section 360bbb-3(b)(1), unless the authorization is terminated or  revoked.  Performed at [facility], [address] [place], [place] [address]   Culture, blood (Routine x 2)     Status: None   Collection Time: 01/12/23  5:23 PM   Specimen: BLOOD  Result Value Ref Range Status   Specimen Description BLOOD BLOOD RIGHT HAND  Final   Special Requests   Final    BOTTLES DRAWN AEROBIC AND ANAEROBIC Blood Culture adequate volume   Culture   Final    NO GROWTH 5 DAYS Performed at [facility], [address] [place], [place] [address]    Report Status 01/17/2023 FINAL  Final  Urine Culture     Status: Abnormal   Collection Time: 01/12/23  8:56 PM   Specimen: Urine, Clean Catch  Result Value Ref Range Status   Specimen Description   Final    URINE, CLEAN CATCH Performed at [facility], [address], [place], [place] [address]     Special Requests   Final    NONE Performed at [facility], [address], [place], [place] [address]    Culture (A)  Final    >=100,000 COLONIES/mL ESCHERICHIA COLI Confirmed Extended Spectrum Beta-Lactamase Producer (ESBL).  In bloodstream infections from ESBL organisms, carbapenems are preferred over piperacillin/tazobactam. They are shown to have a lower risk of mortality.    Report Status 01/15/2023 FINAL  Final   Organism ID, Bacteria ESCHERICHIA COLI (A)  Final      Susceptibility   Escherichia coli - MIC*    AMPICILLIN >=32 RESISTANT Resistant     CEFAZOLIN >=64 RESISTANT Resistant     CEFEPIME 16 RESISTANT Resistant     CEFTRIAXONE >=64 RESISTANT Resistant     CIPROFLOXACIN >=4 RESISTANT Resistant     GENTAMICIN <=1 SENSITIVE Sensitive     IMIPENEM <=0.25 SENSITIVE Sensitive     NITROFURANTOIN <=16 SENSITIVE Sensitive     TRIMETH/SULFA >=320 RESISTANT Resistant     AMPICILLIN/SULBACTAM 16 INTERMEDIATE Intermediate     PIP/TAZO 8 SENSITIVE Sensitive ug/mL    * >=100,000 COLONIES/mL ESCHERICHIA COLI  Respiratory (~20 pathogens) panel by PCR     Status: None   Collection Time: 01/14/23  2:21 PM   Specimen: Nasopharyngeal Swab; Respiratory  Result Value Ref Range Status   Adenovirus NOT DETECTED NOT DETECTED Final   Coronavirus 229E NOT DETECTED NOT DETECTED Final    Comment: (NOTE) The Coronavirus on the Respiratory Panel, DOES NOT test for the novel  Coronavirus (2019 nCoV)    Coronavirus HKU1 NOT DETECTED NOT DETECTED Final   Coronavirus NL63 NOT DETECTED NOT DETECTED Final   Coronavirus OC43 NOT DETECTED NOT DETECTED Final   Metapneumovirus NOT DETECTED NOT DETECTED Final   Rhinovirus / Enterovirus NOT DETECTED NOT DETECTED Final   Influenza A NOT DETECTED NOT DETECTED Final   Influenza B NOT DETECTED NOT DETECTED Final   Parainfluenza Virus 1 NOT DETECTED NOT DETECTED Final   Parainfluenza Virus 2 NOT DETECTED NOT DETECTED Final   Parainfluenza Virus  3 NOT DETECTED NOT DETECTED Final   Parainfluenza Virus 4 NOT DETECTED NOT DETECTED Final   Respiratory Syncytial Virus NOT DETECTED NOT DETECTED Final   Bordetella pertussis NOT DETECTED NOT DETECTED Final   Bordetella Parapertussis NOT DETECTED NOT DETECTED Final   Chlamydophila pneumoniae NOT DETECTED NOT DETECTED Final   Mycoplasma pneumoniae NOT DETECTED NOT DETECTED Final    Comment: Performed at [facility], [address], [place], [place] [address]     Total  time spend on discharging this patient, including the last patient exam, discussing the hospital stay, instructions for ongoing care as it relates to all pertinent caregivers, as well as preparing the medical discharge records, prescriptions, and/or referrals as applicable, is 35 minutes.    Darlin Priestly, MD  [facility] 01/18/2023, 9:41 AM

## 2023-01-19 ENCOUNTER — Ambulatory Visit
Admit: 2023-01-19 | Discharge: 2023-01-19 | Disposition: A | Discharge status: Home / Self Care | Payer: Medicare Other | Source: Ambulatory Visit | Attending: Infectious Diseases | Admitting: Infectious Diseases

## 2023-01-19 DIAGNOSIS — B999 Unspecified infectious disease: Secondary | ICD-10-CM | POA: Diagnosis present

## 2023-01-19 DIAGNOSIS — Z1612 Extended spectrum beta lactamase (ESBL) resistance: Secondary | ICD-10-CM | POA: Diagnosis not present

## 2023-01-19 MED ORDER — SODIUM CHLORIDE 0.9 % IV SOLN
1.0000 g | Freq: Once | INTRAVENOUS | Status: AC
  Administered 2023-01-19: 1 g via INTRAVENOUS
  Filled 2023-01-19: qty 1

## 2023-01-20 ENCOUNTER — Ambulatory Visit
Admit: 2023-01-20 | Discharge: 2023-01-20 | Disposition: A | Discharge status: Home / Self Care | Payer: Medicare Other | Source: Ambulatory Visit | Attending: Infectious Diseases | Admitting: Infectious Diseases

## 2023-01-20 DIAGNOSIS — Z1612 Extended spectrum beta lactamase (ESBL) resistance: Secondary | ICD-10-CM | POA: Insufficient documentation

## 2023-01-20 DIAGNOSIS — N390 Urinary tract infection, site not specified: Secondary | ICD-10-CM | POA: Diagnosis present

## 2023-01-20 DIAGNOSIS — A4151 Sepsis due to Escherichia coli [E. coli]: Secondary | ICD-10-CM | POA: Insufficient documentation

## 2023-01-20 DIAGNOSIS — A4150 Gram-negative sepsis, unspecified: Secondary | ICD-10-CM | POA: Diagnosis present

## 2023-01-20 MED ORDER — SODIUM CHLORIDE 0.9 % IV SOLN
1.0000 g | Freq: Once | INTRAVENOUS | Status: AC
  Administered 2023-01-20: 1 g via INTRAVENOUS
  Filled 2023-01-20: qty 1

## 2023-01-21 ENCOUNTER — Ambulatory Visit
Admit: 2023-01-21 | Discharge: 2023-01-21 | Disposition: A | Discharge status: Home / Self Care | Payer: Medicare Other | Source: Ambulatory Visit | Attending: Infectious Diseases | Admitting: Infectious Diseases

## 2023-01-21 DIAGNOSIS — B999 Unspecified infectious disease: Secondary | ICD-10-CM | POA: Insufficient documentation

## 2023-01-21 DIAGNOSIS — Z1612 Extended spectrum beta lactamase (ESBL) resistance: Secondary | ICD-10-CM | POA: Insufficient documentation

## 2023-01-21 DIAGNOSIS — A0472 Enterocolitis due to Clostridium difficile, not specified as recurrent: Secondary | ICD-10-CM | POA: Diagnosis not present

## 2023-01-21 MED ORDER — SODIUM CHLORIDE 0.9 % IV SOLN
1.0000 g | Freq: Once | INTRAVENOUS | Status: AC
  Administered 2023-01-21: 1 g via INTRAVENOUS
  Filled 2023-01-21: qty 1

## 2023-01-24 ENCOUNTER — Other Ambulatory Visit: Payer: Self-pay

## 2023-01-24 ENCOUNTER — Emergency Department: Payer: Medicare Other

## 2023-01-24 ENCOUNTER — Inpatient Hospital Stay
Admission: EM | Admit: 2023-01-24 | Discharge: 2023-01-26 | DRG: 372 | Disposition: A | Discharge status: Home / Self Care | Payer: Medicare Other | Attending: Internal Medicine | Admitting: Internal Medicine

## 2023-01-24 DIAGNOSIS — R197 Diarrhea, unspecified: Secondary | ICD-10-CM

## 2023-01-24 DIAGNOSIS — R112 Nausea with vomiting, unspecified: Secondary | ICD-10-CM

## 2023-01-24 DIAGNOSIS — Z87891 Personal history of nicotine dependence: Secondary | ICD-10-CM | POA: Diagnosis not present

## 2023-01-24 DIAGNOSIS — E1142 Type 2 diabetes mellitus with diabetic polyneuropathy: Secondary | ICD-10-CM | POA: Diagnosis present

## 2023-01-24 DIAGNOSIS — E66813 Obesity, class 3: Secondary | ICD-10-CM | POA: Insufficient documentation

## 2023-01-24 DIAGNOSIS — E278 Other specified disorders of adrenal gland: Secondary | ICD-10-CM | POA: Diagnosis not present

## 2023-01-24 DIAGNOSIS — I10 Essential (primary) hypertension: Secondary | ICD-10-CM | POA: Diagnosis present

## 2023-01-24 DIAGNOSIS — B9620 Unspecified Escherichia coli [E. coli] as the cause of diseases classified elsewhere: Secondary | ICD-10-CM | POA: Diagnosis present

## 2023-01-24 DIAGNOSIS — Z79899 Other long term (current) drug therapy: Secondary | ICD-10-CM | POA: Diagnosis not present

## 2023-01-24 DIAGNOSIS — Z1612 Extended spectrum beta lactamase (ESBL) resistance: Secondary | ICD-10-CM | POA: Diagnosis present

## 2023-01-24 DIAGNOSIS — Z8249 Family history of ischemic heart disease and other diseases of the circulatory system: Secondary | ICD-10-CM | POA: Diagnosis not present

## 2023-01-24 DIAGNOSIS — E279 Disorder of adrenal gland, unspecified: Secondary | ICD-10-CM | POA: Diagnosis present

## 2023-01-24 DIAGNOSIS — E785 Hyperlipidemia, unspecified: Secondary | ICD-10-CM | POA: Diagnosis present

## 2023-01-24 DIAGNOSIS — N12 Tubulo-interstitial nephritis, not specified as acute or chronic: Secondary | ICD-10-CM | POA: Diagnosis present

## 2023-01-24 DIAGNOSIS — A499 Bacterial infection, unspecified: Secondary | ICD-10-CM | POA: Diagnosis not present

## 2023-01-24 DIAGNOSIS — Z86718 Personal history of other venous thrombosis and embolism: Secondary | ICD-10-CM

## 2023-01-24 DIAGNOSIS — Z6841 Body Mass Index (BMI) 40.0 and over, adult: Secondary | ICD-10-CM

## 2023-01-24 DIAGNOSIS — A0472 Enterocolitis due to Clostridium difficile, not specified as recurrent: Principal | ICD-10-CM | POA: Diagnosis present

## 2023-01-24 DIAGNOSIS — Z7984 Long term (current) use of oral hypoglycemic drugs: Secondary | ICD-10-CM | POA: Diagnosis not present

## 2023-01-24 LAB — CBC
HCT: 40.2 % (ref 36.0–46.0)
Hemoglobin: 12.7 g/dL (ref 12.0–15.0)
MCH: 25.3 pg — ABNORMAL LOW (ref 26.0–34.0)
MCHC: 31.6 g/dL (ref 30.0–36.0)
MCV: 80.2 fL (ref 80.0–100.0)
Platelets: 432 10*3/uL — ABNORMAL HIGH (ref 150–400)
RBC: 5.01 MIL/uL (ref 3.87–5.11)
RDW: 15 % (ref 11.5–15.5)
WBC: 11.6 10*3/uL — ABNORMAL HIGH (ref 4.0–10.5)
nRBC: 0 % (ref 0.0–0.2)

## 2023-01-24 LAB — COMPREHENSIVE METABOLIC PANEL
ALT: 19 U/L (ref 0–44)
AST: 12 U/L — ABNORMAL LOW (ref 15–41)
Albumin: 3.3 g/dL — ABNORMAL LOW (ref 3.5–5.0)
Alkaline Phosphatase: 85 U/L (ref 38–126)
Anion gap: 13 (ref 5–15)
BUN: 11 mg/dL (ref 8–23)
CO2: 24 mmol/L (ref 22–32)
Calcium: 8.9 mg/dL (ref 8.9–10.3)
Chloride: 100 mmol/L (ref 98–111)
Creatinine, Ser: 0.77 mg/dL (ref 0.44–1.00)
GFR, Estimated: >60 mL/min (ref >60)
Glucose, Bld: 133 mg/dL — ABNORMAL HIGH (ref 70–99)
Potassium: 3.7 mmol/L (ref 3.5–5.1)
Sodium: 137 mmol/L (ref 135–145)
Total Bilirubin: 0.8 mg/dL (ref 0.3–1.2)
Total Protein: 7.8 g/dL (ref 6.5–8.1)

## 2023-01-24 LAB — C DIFFICILE QUICK SCREEN W PCR REFLEX
C Diff antigen: POSITIVE — AB
C Diff interpretation: DETECTED
C Diff toxin: POSITIVE — AB

## 2023-01-24 LAB — LIPASE, BLOOD: Lipase: 21 U/L (ref 11–51)

## 2023-01-24 LAB — CBG MONITORING, ED
Glucose-Capillary: 119 mg/dL — ABNORMAL HIGH (ref 70–99)
Glucose-Capillary: 103 mg/dL — ABNORMAL HIGH (ref 70–99)

## 2023-01-24 MED ORDER — MORPHINE SULFATE (PF) 4 MG/ML IV SOLN
4.0000 mg | Freq: Once | INTRAVENOUS | Status: AC
  Administered 2023-01-24: 4 mg via INTRAVENOUS
  Filled 2023-01-24: qty 1

## 2023-01-24 MED ORDER — ACETAMINOPHEN 325 MG PO TABS
650.0000 mg | ORAL_TABLET | Freq: Four times a day (QID) | ORAL | Status: DC | PRN
Start: 2023-01-24 — End: 2023-01-25

## 2023-01-24 MED ORDER — METHOCARBAMOL 500 MG PO TABS
500.0000 mg | ORAL_TABLET | Freq: Four times a day (QID) | ORAL | Status: DC | PRN
  Administered 2023-01-25: 500 mg via ORAL
  Filled 2023-01-24 (×2): qty 1

## 2023-01-24 MED ORDER — AMLODIPINE BESYLATE 10 MG PO TABS
10.0000 mg | ORAL_TABLET | Freq: Every day | ORAL | Status: DC
  Administered 2023-01-25: 10 mg via ORAL
  Filled 2023-01-24: qty 1
  Filled 2023-01-24: qty 2

## 2023-01-24 MED ORDER — MORPHINE SULFATE (PF) 2 MG/ML IV SOLN: 2.0000 mg | INTRAVENOUS | Status: DC | PRN

## 2023-01-24 MED ORDER — ROSUVASTATIN CALCIUM 10 MG PO TABS
40.0000 mg | ORAL_TABLET | Freq: Every day | ORAL | Status: DC
  Administered 2023-01-24 – 2023-01-25 (×2): 40 mg via ORAL
  Filled 2023-01-24: qty 2
  Filled 2023-01-24: qty 4

## 2023-01-24 MED ORDER — ONDANSETRON HCL 4 MG/2ML IJ SOLN
4.0000 mg | Freq: Four times a day (QID) | INTRAMUSCULAR | Status: DC | PRN
  Administered 2023-01-25: 4 mg via INTRAVENOUS
  Filled 2023-01-24: qty 2

## 2023-01-24 MED ORDER — HEPARIN SODIUM (PORCINE) 5000 UNIT/ML IJ SOLN
5000.0000 [IU] | Freq: Three times a day (TID) | INTRAMUSCULAR | Status: DC
  Administered 2023-01-25 – 2023-01-26 (×3): 5000 [IU] via SUBCUTANEOUS
  Filled 2023-01-24 (×4): qty 1

## 2023-01-24 MED ORDER — LISINOPRIL 10 MG PO TABS
5.0000 mg | ORAL_TABLET | Freq: Every day | ORAL | Status: DC
  Administered 2023-01-25: 5 mg via ORAL
  Filled 2023-01-24 (×2): qty 1

## 2023-01-24 MED ORDER — ACETAMINOPHEN 650 MG RE SUPP: 650.0000 mg | Freq: Four times a day (QID) | RECTAL | Status: DC | PRN

## 2023-01-24 MED ORDER — ALBUTEROL SULFATE HFA 108 (90 BASE) MCG/ACT IN AERS: 2.0000 | INHALATION_SPRAY | RESPIRATORY_TRACT | Status: DC | PRN

## 2023-01-24 MED ORDER — IOHEXOL 300 MG/ML  SOLN
100.0000 mL | Freq: Once | INTRAMUSCULAR | Status: AC | PRN
  Administered 2023-01-24: 100 mL via INTRAVENOUS

## 2023-01-24 MED ORDER — INSULIN ASPART 100 UNIT/ML IJ SOLN
0.0000 [IU] | Freq: Three times a day (TID) | INTRAMUSCULAR | Status: DC
  Administered 2023-01-25: 3 [IU] via SUBCUTANEOUS
  Filled 2023-01-24 (×2): qty 1

## 2023-01-24 MED ORDER — GABAPENTIN 300 MG PO CAPS
300.0000 mg | ORAL_CAPSULE | Freq: Three times a day (TID) | ORAL | Status: DC
  Administered 2023-01-24 – 2023-01-25 (×4): 300 mg via ORAL
  Filled 2023-01-24 (×5): qty 1

## 2023-01-24 MED ORDER — INSULIN ASPART 100 UNIT/ML IJ SOLN: 0.0000 [IU] | Freq: Every day | INTRAMUSCULAR | Status: DC

## 2023-01-24 MED ORDER — ONDANSETRON HCL 4 MG/2ML IJ SOLN
4.0000 mg | Freq: Once | INTRAMUSCULAR | Status: AC
  Administered 2023-01-24: 4 mg via INTRAVENOUS
  Filled 2023-01-24: qty 2

## 2023-01-24 MED ORDER — ALBUTEROL SULFATE (2.5 MG/3ML) 0.083% IN NEBU: 2.5000 mg | INHALATION_SOLUTION | RESPIRATORY_TRACT | Status: DC | PRN

## 2023-01-24 MED ORDER — MORPHINE SULFATE (PF) 4 MG/ML IV SOLN
4.0000 mg | INTRAVENOUS | Status: DC | PRN
  Administered 2023-01-24 – 2023-01-25 (×3): 4 mg via INTRAVENOUS
  Filled 2023-01-24 (×3): qty 1

## 2023-01-24 MED ORDER — FIDAXOMICIN 200 MG PO TABS
200.0000 mg | ORAL_TABLET | Freq: Two times a day (BID) | ORAL | Status: DC
  Administered 2023-01-24 – 2023-01-25 (×3): 200 mg via ORAL
  Filled 2023-01-24 (×3): qty 1

## 2023-01-24 MED ORDER — MONTELUKAST SODIUM 10 MG PO TABS
10.0000 mg | ORAL_TABLET | Freq: Every day | ORAL | Status: DC
  Administered 2023-01-24 – 2023-01-25 (×2): 10 mg via ORAL
  Filled 2023-01-24 (×2): qty 1

## 2023-01-24 MED ORDER — HYDRALAZINE HCL 20 MG/ML IJ SOLN: 5.0000 mg | Freq: Three times a day (TID) | INTRAMUSCULAR | Status: DC | PRN

## 2023-01-24 MED ORDER — ONDANSETRON HCL 4 MG PO TABS: 4.0000 mg | ORAL_TABLET | Freq: Four times a day (QID) | ORAL | Status: DC | PRN

## 2023-01-24 MED ORDER — SODIUM CHLORIDE 0.9 % IV SOLN: 12.5000 mg | Freq: Four times a day (QID) | INTRAVENOUS | Status: AC | PRN

## 2023-01-24 MED ORDER — SENNOSIDES-DOCUSATE SODIUM 8.6-50 MG PO TABS: 1.0000 | ORAL_TABLET | Freq: Every evening | ORAL | Status: DC | PRN

## 2023-01-24 MED ORDER — METRONIDAZOLE 500 MG/100ML IV SOLN
500.0000 mg | Freq: Once | INTRAVENOUS | Status: AC
  Administered 2023-01-24: 500 mg via INTRAVENOUS
  Filled 2023-01-24: qty 100

## 2023-01-24 NOTE — ED Triage Notes (Signed)
Pt in via EMS from home with c/o lower abd pain, NV, and diarrhea. Pt was seen last wrrk for the same, dx with E.coli and was discharged. Pt reports thinks she still has it   98.8, 113/69, HR 88, 96% RA

## 2023-01-24 NOTE — Assessment & Plan Note (Signed)
Home rosuvastatin 40 mg nightly resumed 

## 2023-01-24 NOTE — ED Provider Notes (Signed)
[facility] Provider Note    Event Date/Time   First MD Initiated Contact with Patient 01/24/23 1121     (approximate)  History   Chief Complaint: Abdominal Pain  HPI  Alexandra Macias is a 62 y.o. female with a past medical history of hypertension, recently discharged 01/18/2023 per chart review for colitis/sepsis, presents to the emergency department for worsening abdominal pain and diarrhea.  According to the patient's discharge summary 01/18/2023 patient was admitted to the hospital for colitis and sepsis found to have ESBL E. coli UTI with pyelonephritis, patient was discharged continued to have 3 additional days of IV antibiotics.  Antibiotics finished 01/21/2023.  She states since then the abdominal pain has progressively worsened once again and she is now having 10-15 episodes of diarrhea per day.  Denies any nausea or vomiting.  Denies any known fever.  Afebrile in the emergency department.  Physical Exam   Triage Vital Signs: ED Triage Vitals  Encounter Vitals Group     BP 01/24/23 1005 (!) 118/59     Systolic BP Percentile --      Diastolic BP Percentile --      Pulse Rate 01/24/23 1005 96     Resp 01/24/23 1024 17     Temp 01/24/23 1005 97.8 F (36.6 C)     Temp src --      SpO2 01/24/23 1005 93 %     Weight --      Height --      Head Circumference --      Peak Flow --      Pain Score 01/24/23 1004 9     Pain Loc --      Pain Education --      Exclude from Growth Chart --     Most recent vital signs: Vitals:   01/24/23 1005 01/24/23 1024  BP: (!) 118/59   Pulse: 96   Resp:  17  Temp: 97.8 F (36.6 C)   SpO2: 93%     General: Awake, no distress.  CV:  Good peripheral perfusion.  Regular rate and rhythm  Resp:  Normal effort.  Equal breath sounds bilaterally.  Abd:  Soft, mild diffuse tenderness with moderate tenderness to the left lower quadrant.  No rebound or guarding. ED Results / Procedures / Treatments   RADIOLOGY  I  have reviewed and interpreted CT images.  No significant obstruction or abnormality seen on my evaluation. Radiology is read the CT scan as diffuse colonic diverticulosis mild diverticulitis.  MEDICATIONS ORDERED IN ED: Medications  morphine (PF) 4 MG/ML injection 4 mg (has no administration in time range)  ondansetron (ZOFRAN) injection 4 mg (has no administration in time range)     IMPRESSION / MDM / ASSESSMENT AND PLAN / ED COURSE  I reviewed the triage vital signs and the nursing notes.  Patient's presentation is most consistent with acute presentation with potential threat to life or bodily function.  Patient presents emergency department for worsening abdominal pain after completing antibiotics 01/21/2023.  Recent admission for ESBL pyelonephritis.  Given the antibiotics and now worsening diarrhea and abdominal pain concern for possible colitis versus return of/worsening pyelonephritis we will obtain CT imaging with contrast.  Patient's lab work today shows an overall reassuring CBC slight leukocytosis of 11,600, chemistry is reassuring lipase is normal.  Given the patient's increased diarrhea we will obtain C. difficile testing.  We will obtain a urine sample.  Will continue to closely monitor while awaiting  CT and further lab results.  We will treat pain and nausea in the emergency department.  Patient C. difficile test is positive.  CT scan shows signs of mild diverticulitis.  Given the C. difficile positive status with lower abdominal pain and increasing diarrhea we will admit to the hospital service for further workup and treatment.  FINAL CLINICAL IMPRESSION(S) / ED DIAGNOSES   Abdominal pain Diarrhea    Note:  This document was prepared using Dragon voice recognition software and may include unintentional dictation errors.   Minna Antis, MD 01/24/23 [phone]

## 2023-01-24 NOTE — Assessment & Plan Note (Signed)
-   This complicates overall care and prognosis.  

## 2023-01-24 NOTE — ED Triage Notes (Signed)
Pt comes with c/o belly pain, vomiting and diarrhea. Pt states she was just seen here and admitted for e coli. Pt states this started yesterday.

## 2023-01-24 NOTE — Assessment & Plan Note (Addendum)
Suspect secondary to Clostridioides difficile infection Fidaxomicin 200 mg p.o. twice daily, 10-day course initiated on admission Encourage oral hydration Ondansetron 4 mg p.o./IV every 6 hours as needed for nausea, vomiting 7 days ordered; Phenergan 12.5 mg IV every 6 hours as needed for refractory nausea, vomiting, 1 day ordered Strict I's and O's Patient can have fecal management system with Flexi-Seal if patient continues to have diarrhea greater than 3 episodes in 12 h

## 2023-01-24 NOTE — ED Notes (Signed)
Pt in bathroom to provide stool sample.

## 2023-01-24 NOTE — Assessment & Plan Note (Signed)
Home amlodipine 10 mg daily, lisinopril 5 mg daily resumed on admission Hydralazine 5 mg IV every 8 hours as needed for SBP greater 165, 7 days ordered

## 2023-01-24 NOTE — Hospital Course (Signed)
Ms. Catalina Strahm is a 62 year old female with history of hypertension, hyperlipidemia, non-insulin-dependent diabetes mellitus, neuropathy, who presents emergency department for chief concerns of abdominal pain, nausea, vomiting, diarrhea.  Vitals in the ED showed temperature of 97.8, respiration rate of 17, heart rate of 96, blood pressure 118/59, SpO2 of 93% on room air.  Serum sodium is 137, potassium 3.7, chloride 100, bicarb 24, BUN of 11, serum creatinine of 0.77, EGFR greater than 60, nonfasting blood glucose 133, WBC 11.6, hemoglobin 12.7, platelets of 432.  Clostridioides difficile antigen and toxin were both positive in the emergency department.  ED treatment: Morphine 4 mg IV one-time dose, ondansetron 4 mg IV, Flagyl 500 mg IV one-time dose.

## 2023-01-24 NOTE — H&P (Addendum)
History and Physical   Alexandra Macias OZH:[id number] DOB: [birth year] DOA: 01/24/2023  PCP: Wilford Corner, PA-C  Outpatient Specialists: Dr. [facility] Patient coming from: Home  I have personally briefly reviewed patient's old medical records in [facility] EMR.  Chief Concern: Nausea, vomiting, diarrhea  HPI: Ms. Alexandra Macias is a 62 year old female with history of hypertension, hyperlipidemia, non-insulin-dependent diabetes mellitus, neuropathy, who presents emergency department for chief concerns of abdominal pain, nausea, vomiting, diarrhea.  Vitals in the ED showed temperature of 97.8, respiration rate of 17, heart rate of 96, blood pressure 118/59, SpO2 of 93% on room air.  Serum sodium is 137, potassium 3.7, chloride 100, bicarb 24, BUN of 11, serum creatinine of 0.77, EGFR greater than 60, nonfasting blood glucose 133, WBC 11.6, hemoglobin 12.7, platelets of 432.  Clostridioides difficile antigen and toxin were both positive in the emergency department.  ED treatment: Morphine 4 mg IV one-time dose, ondansetron 4 mg IV, Flagyl 500 mg IV one-time dose. --------------------------- At bedside, patient patient is awake alert and oriented x 3.  She reports her nausea and vomiting has improved since being in the hospital.  She reports she is able to tolerate p.o. intake and has been drinking a lot of water in the emergency department.  She denies blood in her diarrhea.  Patient reports nausea and vomiting with white sputum/that is bitter in taste.  She reports she also coughs up the same white sputum that is bitter in taste.  Social history: She lives at home.  She denies tobacco, EtOH, recreational drug use.  She is a former tobacco user, has stopped using tobacco products for 3 weeks.  She states she will not use it anymore.  ROS: Constitutional: no weight change, no fever ENT/Mouth: no sore throat, no rhinorrhea Eyes: no eye pain, no vision  changes Cardiovascular: no chest pain, no dyspnea,  no edema, no palpitations Respiratory: + cough, no sputum, no wheezing Gastrointestinal: + nausea, + vomiting, + diarrhea, no constipation Genitourinary: no urinary incontinence, no dysuria, no hematuria Musculoskeletal: no arthralgias, no myalgias Skin: no skin lesions, no pruritus, Neuro: + weakness, no loss of consciousness, no syncope Psych: no anxiety, no depression, + decrease appetite Heme/Lymph: no bruising, no bleeding  ED Course: Discussed with EDP, patient requiring hospitalization for chief concerns of C. difficile colitis with possible mild diverticulitis.  Assessment/Plan  Principal Problem:   C. difficile colitis Active Problems:   ESBL (extended spectrum beta-lactamase) producing bacteria infection   Nausea vomiting and diarrhea   Essential hypertension   Dyslipidemia   Type 2 diabetes mellitus with peripheral neuropathy (HCC)   Left adrenal mass (HCC)   Obesity, Class III, BMI 40-49.9 (morbid obesity) (HCC)   Assessment and Plan:  * C. difficile colitis Fidaxomicin 200 mg PO BID 10-day course initiated on admission Patient is status post metronidazole 500 mg IV one-time dose per EDP Nursing order for as needed Flexi-Seal fecal management system ordered Admit telemetry medical, inpatient  Nausea vomiting and diarrhea Suspect secondary to Clostridioides difficile infection Fidaxomicin 200 mg p.o. twice daily, 10-day course initiated on admission Encourage oral hydration Ondansetron 4 mg p.o./IV every 6 hours as needed for nausea, vomiting 7 days ordered; Phenergan 12.5 mg IV every 6 hours as needed for refractory nausea, vomiting, 1 day ordered Strict I's and O's Patient can have fecal management system with Flexi-Seal if patient continues to have diarrhea greater than 3 episodes in 12 h  Essential hypertension Home amlodipine 10 mg  daily, lisinopril 5 mg daily resumed on admission Hydralazine 5 mg IV every  8 hours as needed for SBP greater 165, 7 days ordered  Type 2 diabetes mellitus with peripheral neuropathy (HCC) A1c was 7.1 from outside hospital on 11/30/2022 Home semaglutide not resumed on admission Insulin SSI with at bedtime coverage, obese dosing ordered Goal inpatient blood glucose levels 140-180  Dyslipidemia Home rosuvastatin 40 mg nightly resumed  Obesity, Class III, BMI 40-49.9 (morbid obesity) (HCC) This complicates overall care and prognosis.   Left adrenal mass (HCC) 4 cm in size, patient was made aware and informed continued outpatient follow-up with PCP as appropriate  Pending med reconciliation I resumed home meds as best as I can on admission Chart reviewed.   Patient was hospitalized from 01/12/2023 to 01/18/2023: For ESBL E. coli UTI with left pyelonephritis.  Patient was started on ceftriaxone admission however continued to have high fevers and left flank pain.  Antibiotic was switched to IV meropenem with resolution of fevers.  ID specialist was consulted and patient was then switched to ertapenem, patient received 4 days of appropriate antibiotics and will complete 3 more days course of ertapenem at infusion center to complete 7-day course.  DVT prophylaxis: Heparin 5000 units subcutaneous every 8 hours Code Status: Full code Diet: Heart healthy/carb modified Family Communication: No Disposition Plan: Pending clinical course Consults called: None at this time Admission status: Telemetry medical, inpatient  Past Medical History:  Diagnosis Date   DVT (deep venous thrombosis) (HCC) 2005   post op hysterectomy   Hypertension    Past Surgical History:  Procedure Laterality Date   ABDOMINAL HYSTERECTOMY  2005   FOOT FUSION  2015   right-fx   HARDWARE REMOVAL Right 07/05/2014   Procedure: REMOVAL OF DEEP IMPLANTS OF RIGHT ANKLE;  Surgeon: Toni Arthurs, MD;  Location: [facility];  Service: Orthopedics;  Laterality: Right;   ORIF FOOT FRACTURE   2010   left   ORIF WRIST FRACTURE     both rt and lt   Social History:  reports that she quit smoking about 9 years ago. Her smoking use included cigarettes. She has never used smokeless tobacco. She reports current alcohol use. She reports that she does not use drugs.  No Known Allergies Family History  Problem Relation Age of Onset   Hypertension Mother    Hypertension Father    Heart attack Father    Hypertension Sister    Breast cancer Neg Hx    Family history: Family history reviewed and not pertinent.  Prior to Admission medications   Medication Sig Start Date End Date Taking? Authorizing Provider  albuterol (PROVENTIL HFA;VENTOLIN HFA) 108 (90 Base) MCG/ACT inhaler Inhale 2 puffs into the lungs every 4 (four) hours as needed for wheezing or shortness of breath. 04/23/17   Triplett, Cari B, FNP  amLODipine (NORVASC) 10 MG tablet Take 10 mg by mouth daily.    [provider]  gabapentin (NEURONTIN) 300 MG capsule Take 1 capsule (300 mg total) by mouth 3 (three) times daily. Home med. 01/18/23   Darlin Priestly, MD  lisinopril (ZESTRIL) 5 MG tablet Take 5 mg by mouth daily.    [provider]  methocarbamol (ROBAXIN) 500 MG tablet 1-2 tablets every 6 hours prn muscle spasms 01/18/23   Darlin Priestly, MD  montelukast (SINGULAIR) 10 MG tablet Take 10 mg by mouth at bedtime.    [provider]  rosuvastatin (CRESTOR) 40 MG tablet Take 40 mg by mouth daily.  [provider]  Semaglutide (RYBELSUS) 3 MG TABS Take 3 mg by mouth daily.    [provider]   Physical Exam: Vitals:   01/24/23 1005 01/24/23 1024 01/24/23 1519  BP: (!) 118/59  127/60  Pulse: 96  85  Resp:  17 18  Temp: 97.8 F (36.6 C)  98.3 F (36.8 C)  TempSrc:   Oral  SpO2: 93%  96%   Constitutional: appears age-appropriate, NAD, calm Eyes: PERRL, lids and conjunctivae normal ENMT: Mucous membranes are moist. Posterior pharynx clear of any exudate or lesions. Age-appropriate  dentition. Hearing appropriate Neck: normal, supple, no masses, no thyromegaly Respiratory: clear to auscultation bilaterally, no wheezing, no crackles. Normal respiratory effort. No accessory muscle use.  Cardiovascular: Regular rate and rhythm, no murmurs / rubs / gallops. No extremity edema. 2+ pedal pulses. No carotid bruits.  Abdomen: Morbidly obese abdomen, no tenderness, no masses palpated, no hepatosplenomegaly. Bowel sounds positive.  Musculoskeletal: no clubbing / cyanosis. No joint deformity upper and lower extremities. Good ROM, no contractures, no atrophy. Normal muscle tone.  Skin: no rashes, lesions, ulcers. No induration Neurologic: Sensation intact. Strength 5/5 in all 4.  Psychiatric: Normal judgment and insight. Alert and oriented x 3. Normal mood.   EKG: Not indicated at this time  Chest x-ray on Admission: Not indicated at this time  CT ABDOMEN PELVIS W CONTRAST  Result Date: 01/24/2023 CLINICAL DATA:  Lower abdominal pain, nausea and vomiting, and diarrhea for several weeks. EXAM: CT ABDOMEN AND PELVIS WITH CONTRAST TECHNIQUE: Multidetector CT imaging of the abdomen and pelvis was performed using the standard protocol following bolus administration of intravenous contrast. RADIATION DOSE REDUCTION: This exam was performed according to the departmental dose-optimization program which includes automated exposure control, adjustment of the mA and/or kV according to patient size and/or use of iterative reconstruction technique. CONTRAST:  OMNIPAQUE IOHEXOL 300 MG/ML  SOLN COMPARISON:  01/13/2023 FINDINGS: Lower Chest: No acute findings. Hepatobiliary: No suspicious hepatic masses identified. Gallbladder is unremarkable. No evidence of biliary ductal dilatation. Pancreas:  No mass or inflammatory changes. Spleen: Within normal limits in size and appearance. Adrenals/Urinary Tract: 3.7 cm left adrenal mass remains stable. No suspicious masses identified. No evidence of ureteral  calculi or hydronephrosis. Stomach/Bowel: Extensive diverticulosis is again seen throughout the colon. New colonic wall thickening and pericolonic soft tissue stranding is seen involving the sigmoid colon, and involving the ascending colon, consistent with diverticulitis at these sites. Normal appendix visualized. No evidence of abscess or bowel obstruction. Vascular/Lymphatic: No pathologically enlarged lymph nodes. No acute vascular findings. Reproductive: Prior hysterectomy noted. Adnexal regions are unremarkable in appearance. Other:  Stable small umbilical hernia, which contains only fat. Musculoskeletal:  No suspicious bone lesions identified. IMPRESSION: Severe diffuse colonic diverticulosis, with mild acute diverticulitis involving both the the ascending and sigmoid colon. No evidence of abscess or other complication. Stable small umbilical hernia, which contains only fat. Stable 4 cm indeterminate left adrenal mass. Consider continued follow-up by CT in 6 months, or further characterization with abdomen MRI without and with contrast. Electronically Signed   By: Danae Orleans M.D.   On: 01/24/2023 14:14    Labs on Admission: I have personally reviewed following labs  CBC: Recent Labs  Lab 01/24/23 1005  WBC 11.6*  HGB 12.7  HCT 40.2  MCV 80.2  PLT 432*   Basic Metabolic Panel: Recent Labs  Lab 01/24/23 1005  NA 137  K 3.7  CL 100  CO2 24  GLUCOSE 133*  BUN 11  CREATININE 0.77  CALCIUM 8.9   GFR: Estimated Creatinine Clearance: 120.3 mL/min (by C-G formula based on SCr of 0.77 mg/dL).  Liver Function Tests: Recent Labs  Lab 01/24/23 1005  AST 12*  ALT 19  ALKPHOS 85  BILITOT 0.8  PROT 7.8  ALBUMIN 3.3*   Recent Labs  Lab 01/24/23 1005  LIPASE 21   CBG: Recent Labs  Lab 01/17/23 2113  GLUCAP 165*   Urine analysis:    Component Value Date/Time   COLORURINE AMBER (A) 01/12/2023 2057   APPEARANCEUR CLOUDY (A) 01/12/2023 2057   LABSPEC 1.023 01/12/2023 2057    PHURINE 5.0 01/12/2023 2057   GLUCOSEU NEGATIVE 01/12/2023 2057   HGBUR MODERATE (A) 01/12/2023 2057   BILIRUBINUR NEGATIVE 01/12/2023 2057   KETONESUR NEGATIVE 01/12/2023 2057   PROTEINUR 100 (A) 01/12/2023 2057   NITRITE NEGATIVE 01/12/2023 2057   LEUKOCYTESUR MODERATE (A) 01/12/2023 2057   This document was prepared using Dragon Voice Recognition software and may include unintentional dictation errors.  Dr. Sedalia Muta [facility]  If 7PM-7AM, please contact overnight-coverage provider If 7AM-7PM, please contact day attending provider [email or web address]  01/24/2023, 3:35 PM

## 2023-01-24 NOTE — Assessment & Plan Note (Addendum)
Fidaxomicin 200 mg PO BID 10-day course initiated on admission Patient is status post metronidazole 500 mg IV one-time dose per EDP Nursing order for as needed Flexi-Seal fecal management system ordered Enteric precaution ordered Admit telemetry medical, inpatient

## 2023-01-24 NOTE — Assessment & Plan Note (Signed)
4 cm in size, patient was made aware and informed continued outpatient follow-up with PCP as appropriate

## 2023-01-24 NOTE — Assessment & Plan Note (Signed)
A1c was 7.1 from outside hospital on 11/30/2022 Home semaglutide not resumed on admission Insulin SSI with at bedtime coverage, obese dosing ordered Goal inpatient blood glucose levels 140-180

## 2023-01-25 ENCOUNTER — Encounter: Payer: Self-pay | Admitting: Internal Medicine

## 2023-01-25 ENCOUNTER — Other Ambulatory Visit (HOSPITAL_COMMUNITY): Payer: Self-pay

## 2023-01-25 DIAGNOSIS — A499 Bacterial infection, unspecified: Secondary | ICD-10-CM

## 2023-01-25 DIAGNOSIS — I10 Essential (primary) hypertension: Secondary | ICD-10-CM

## 2023-01-25 DIAGNOSIS — E1142 Type 2 diabetes mellitus with diabetic polyneuropathy: Secondary | ICD-10-CM

## 2023-01-25 DIAGNOSIS — A0472 Enterocolitis due to Clostridium difficile, not specified as recurrent: Secondary | ICD-10-CM | POA: Diagnosis not present

## 2023-01-25 DIAGNOSIS — E278 Other specified disorders of adrenal gland: Secondary | ICD-10-CM

## 2023-01-25 DIAGNOSIS — R197 Diarrhea, unspecified: Secondary | ICD-10-CM

## 2023-01-25 DIAGNOSIS — R112 Nausea with vomiting, unspecified: Secondary | ICD-10-CM

## 2023-01-25 DIAGNOSIS — Z1612 Extended spectrum beta lactamase (ESBL) resistance: Secondary | ICD-10-CM

## 2023-01-25 LAB — CBC
HCT: 36.6 % (ref 36.0–46.0)
Hemoglobin: 11.4 g/dL — ABNORMAL LOW (ref 12.0–15.0)
MCH: 25.2 pg — ABNORMAL LOW (ref 26.0–34.0)
MCHC: 31.1 g/dL (ref 30.0–36.0)
MCV: 80.8 fL (ref 80.0–100.0)
Platelets: 392 10*3/uL (ref 150–400)
RBC: 4.53 MIL/uL (ref 3.87–5.11)
RDW: 15.1 % (ref 11.5–15.5)
WBC: 7.4 10*3/uL (ref 4.0–10.5)
nRBC: 0 % (ref 0.0–0.2)

## 2023-01-25 LAB — CBG MONITORING, ED
Glucose-Capillary: 138 mg/dL — ABNORMAL HIGH (ref 70–99)
Glucose-Capillary: 125 mg/dL — ABNORMAL HIGH (ref 70–99)
Glucose-Capillary: 107 mg/dL — ABNORMAL HIGH (ref 70–99)

## 2023-01-25 LAB — BASIC METABOLIC PANEL
Anion gap: 11 (ref 5–15)
BUN: 11 mg/dL (ref 8–23)
CO2: 26 mmol/L (ref 22–32)
Calcium: 8.6 mg/dL — ABNORMAL LOW (ref 8.9–10.3)
Chloride: 97 mmol/L — ABNORMAL LOW (ref 98–111)
Creatinine, Ser: 0.84 mg/dL (ref 0.44–1.00)
GFR, Estimated: >60 mL/min (ref >60)
Glucose, Bld: 123 mg/dL — ABNORMAL HIGH (ref 70–99)
Potassium: 4.3 mmol/L (ref 3.5–5.1)
Sodium: 134 mmol/L — ABNORMAL LOW (ref 135–145)

## 2023-01-25 LAB — HEMOGLOBIN A1C
Hgb A1c MFr Bld: 6.8 % — ABNORMAL HIGH (ref 4.8–5.6)
Mean Plasma Glucose: 148.46 mg/dL

## 2023-01-25 LAB — GLUCOSE, CAPILLARY: Glucose-Capillary: 124 mg/dL — ABNORMAL HIGH (ref 70–99)

## 2023-01-25 MED ORDER — ACETAMINOPHEN 325 MG PO TABS: 650.0000 mg | ORAL_TABLET | Freq: Four times a day (QID) | ORAL | Status: DC | PRN

## 2023-01-25 MED ORDER — VANCOMYCIN 50 MG/ML ORAL SOLUTION
125.0000 mg | Freq: Four times a day (QID) | ORAL | Status: DC
  Administered 2023-01-25: 125 mg
  Filled 2023-01-25 (×4): qty 2.5

## 2023-01-25 MED ORDER — ACETAMINOPHEN 650 MG RE SUPP: 650.0000 mg | Freq: Four times a day (QID) | RECTAL | Status: DC | PRN

## 2023-01-25 MED ORDER — MORPHINE SULFATE (PF) 2 MG/ML IV SOLN
2.0000 mg | INTRAVENOUS | Status: DC | PRN
  Administered 2023-01-25 – 2023-01-26 (×3): 2 mg via INTRAVENOUS
  Filled 2023-01-25 (×4): qty 1

## 2023-01-25 NOTE — TOC Benefit Eligibility Note (Signed)
Patient Product/process development scientist completed.    The patient is insured through [facility]. Patient has Medicare and is not eligible for a copay card, but may be able to apply for patient assistance, if available.    Ran test claim for Dificid 200 mg and the current 10 day co-pay is $1,641.73.  Ran test claim for vancomycin 125 mg and the current 10 day co-pay is $100.00.  This test claim was processed through [facility]- copay amounts may vary at other pharmacies due to pharmacy/plan contracts, or as the patient moves through the different stages of their insurance plan.     Alexandra Macias, CPHT Pharmacy Technician III Certified Patient Advocate [facility] Patient Advocate Team Direct Number: [phone]  Fax: [phone]

## 2023-01-25 NOTE — Progress Notes (Signed)
Progress Note   Patient: Alexandra Macias:[id number] DOB: Sep 06, 1960 DOA: 01/24/2023     1 DOS: the patient was seen and examined on 01/25/2023   Brief hospital course: Ms. Alexandra Macias is a 62 year old female with history of hypertension, hyperlipidemia, non-insulin-dependent diabetes mellitus, neuropathy, who presents emergency department for chief concerns of abdominal pain, nausea, vomiting, diarrhea. Patient was recently discharged for ESBL E. coli UTI with left pyelonephritis on ertapenem therapy.  In the ED C. difficile antigen and toxin both positive, admitted to hospitalist service for further management evaluation.  Assessment and Plan: * C. difficile colitis Nausea vomiting and diarrhea Fidaxomicin therapy is very costly due to her insurance per pharmacy. Will start her on vancomycin oral therapy. Continue enteric precautions. Continue antiemetics, pain medications. Advance diet as tolerated.  Essential hypertension Continue home dose amlodipine 10 mg daily, lisinopril 5 mg daily. IV hydralazine as needed ordered  Type 2 diabetes mellitus with peripheral neuropathy (HCC) A1c added to the prior lab. Semaglutide held for now. Continue Accu-Cheks, sliding scale insulin as per floor protocol  Dyslipidemia Continue rosuvastatin 40 mg nightly.  Obesity, Class III, BMI 40-49.9 (morbid obesity) (HCC) Worsening her present condition. Diet, exercise and weight reduction advised.  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 Incentive spirometry. Nursing supportive care. Fall, aspiration precautions. DVT prophylaxis   Code Status: Full Code  Subjective: Patient is seen and examined today morning.  She did not eat since last 2 days due to her nausea, abdominal discomfort and diarrhea.  Thinks that she will be able to eat today.  Does have abdominal tenderness but diarrhea  improved.  Physical Exam: Vitals:   01/25/23 1200 01/25/23 1230 01/25/23 1300 01/25/23 1420  BP: 108/64  121/74   Pulse: 78  71   Resp: 17  17   Temp:  97.7 F (36.5 C)    TempSrc:  Oral    SpO2: 94%  97%   Height:    5\' 7"  (1.702 m)    General - Middle aged morbidly obese African American female, no apparent distress HEENT - PERRLA, EOMI, atraumatic head, non tender sinuses. Lung - Clear, bibasilar rales, rhonchi, no wheezes. Heart - S1, S2 heard, no murmurs, rubs, trace pedal edema. Abdomen - Soft, lower quadrant tenderness, obese, bowel sounds good Neuro - Alert, awake and oriented x 3, non focal exam. Skin - Warm and dry.  Data Reviewed:      Latest Ref Rng & Units 01/25/2023    5:47 AM 01/24/2023   10:05 AM 01/15/2023    6:46 AM  CBC  WBC 4.0 - 10.5 K/uL 7.4  11.6  5.0   Hemoglobin 12.0 - 15.0 g/dL 24.4  01.0  27.2   Hematocrit 36.0 - 46.0 % 36.6  40.2  36.1   Platelets 150 - 400 K/uL 392  432  249       Latest Ref Rng & Units 01/25/2023    5:47 AM 01/24/2023   10:05 AM 01/15/2023    6:46 AM  BMP  Glucose 70 - 99 mg/dL 536  644  034   BUN 8 - 23 mg/dL 11  11  11    Creatinine 0.44 - 1.00 mg/dL 7.42  5.95  6.38   Sodium 135 - 145 mmol/L 134  137  135   Potassium 3.5 - 5.1 mmol/L 4.3  3.7  3.8   Chloride  98 - 111 mmol/L 97  100  97   CO2 22 - 32 mmol/L 26  24  26    Calcium 8.9 - 10.3 mg/dL 8.6  8.9  9.0    CT ABDOMEN PELVIS W CONTRAST  Result Date: 01/24/2023 CLINICAL DATA:  Lower abdominal pain, nausea and vomiting, and diarrhea for several weeks. EXAM: CT ABDOMEN AND PELVIS WITH CONTRAST TECHNIQUE: Multidetector CT imaging of the abdomen and pelvis was performed using the standard protocol following bolus administration of intravenous contrast. RADIATION DOSE REDUCTION: This exam was performed according to the departmental dose-optimization program which includes automated exposure control, adjustment of the mA and/or kV according to patient size and/or use  of iterative reconstruction technique. CONTRAST:  OMNIPAQUE IOHEXOL 300 MG/ML  SOLN COMPARISON:  01/13/2023 FINDINGS: Lower Chest: No acute findings. Hepatobiliary: No suspicious hepatic masses identified. Gallbladder is unremarkable. No evidence of biliary ductal dilatation. Pancreas:  No mass or inflammatory changes. Spleen: Within normal limits in size and appearance. Adrenals/Urinary Tract: 3.7 cm left adrenal mass remains stable. No suspicious masses identified. No evidence of ureteral calculi or hydronephrosis. Stomach/Bowel: Extensive diverticulosis is again seen throughout the colon. New colonic wall thickening and pericolonic soft tissue stranding is seen involving the sigmoid colon, and involving the ascending colon, consistent with diverticulitis at these sites. Normal appendix visualized. No evidence of abscess or bowel obstruction. Vascular/Lymphatic: No pathologically enlarged lymph nodes. No acute vascular findings. Reproductive: Prior hysterectomy noted. Adnexal regions are unremarkable in appearance. Other:  Stable small umbilical hernia, which contains only fat. Musculoskeletal:  No suspicious bone lesions identified. IMPRESSION: Severe diffuse colonic diverticulosis, with mild acute diverticulitis involving both the the ascending and sigmoid colon. No evidence of abscess or other complication. Stable small umbilical hernia, which contains only fat. Stable 4 cm indeterminate left adrenal mass. Consider continued follow-up by CT in 6 months, or further characterization with abdomen MRI without and with contrast. Electronically Signed   By: Danae Orleans M.D.   On: 01/24/2023 14:14     Family Communication: Discussed with patient, she understand and agree. All questions answereed.    Disposition: Status is: Inpatient Remains inpatient appropriate because: Admitted with C. difficile colitis, abdominal pain, diarrhea requiring IV pain medications, oral vancomycin therapy. Planned  Discharge Destination: Home    Time spent: 39 minutes  Author: Marcelino Duster, MD 01/25/2023 4:01 PM Secure chat 7am to 7pm For on call review [email or web address].

## 2023-01-26 DIAGNOSIS — Z6841 Body Mass Index (BMI) 40.0 and over, adult: Secondary | ICD-10-CM

## 2023-01-26 DIAGNOSIS — A499 Bacterial infection, unspecified: Secondary | ICD-10-CM | POA: Diagnosis not present

## 2023-01-26 DIAGNOSIS — A0472 Enterocolitis due to Clostridium difficile, not specified as recurrent: Secondary | ICD-10-CM | POA: Diagnosis not present

## 2023-01-26 DIAGNOSIS — I10 Essential (primary) hypertension: Secondary | ICD-10-CM | POA: Diagnosis not present

## 2023-01-26 DIAGNOSIS — R112 Nausea with vomiting, unspecified: Secondary | ICD-10-CM | POA: Diagnosis not present

## 2023-01-26 LAB — URINALYSIS, ROUTINE W REFLEX MICROSCOPIC
Glucose, UA: NEGATIVE mg/dL
Hgb urine dipstick: NEGATIVE
Ketones, ur: 5 mg/dL — AB
Leukocytes,Ua: NEGATIVE
Nitrite: NEGATIVE
Protein, ur: 30 mg/dL — AB
Specific Gravity, Urine: 1.029 (ref 1.005–1.030)
pH: 5 (ref 5.0–8.0)

## 2023-01-26 MED ORDER — ONDANSETRON HCL 4 MG PO TABS: 4.0000 mg | ORAL_TABLET | Freq: Every day | ORAL | 1 refills | Status: AC | PRN

## 2023-01-26 MED ORDER — VANCOMYCIN 50 MG/ML ORAL SOLUTION: 125.0000 mg | Freq: Four times a day (QID) | ORAL | 0 refills | Status: AC

## 2023-01-26 NOTE — Progress Notes (Signed)
Pt discharged home with brother in stable condition. DC instructions given. NO immediate questions or concerns. Scripts sent to pharmacy of choice. Pt declined morning meds and CBG. MD made aware. Discharged from room via wheelchair.

## 2023-01-26 NOTE — Discharge Summary (Signed)
Physician Discharge Summary   Patient: Alexandra Macias MRN: [id number] DOB: 11-08-1960  Admit date:     01/24/2023  Discharge date: 01/26/23  Discharge Physician: Marcelino Duster   PCP: Wilford Corner, PA-C   Recommendations at discharge:  {Tip this will not be part of the note when signed- Example include specific recommendations for outpatient follow-up, pending tests to follow-up on. (Optional):26781}  ***  Discharge Diagnoses: Principal Problem:   C. difficile colitis Active Problems:   ESBL (extended spectrum beta-lactamase) producing bacteria infection   Nausea vomiting and diarrhea   Essential hypertension   Dyslipidemia   Type 2 diabetes mellitus with peripheral neuropathy (HCC)   Left adrenal mass (HCC)   Obesity, Class III, BMI 40-49.9 (morbid obesity) (HCC)  Resolved Problems:   * No resolved hospital problems. [facility] Course: Alexandra Macias is a 62 year old female with history of hypertension, hyperlipidemia, non-insulin-dependent diabetes mellitus, neuropathy, who presents emergency department for chief concerns of abdominal pain, nausea, vomiting, diarrhea.  Vitals in the ED showed temperature of 97.8, respiration rate of 17, heart rate of 96, blood pressure 118/59, SpO2 of 93% on room air.  Serum sodium is 137, potassium 3.7, chloride 100, bicarb 24, BUN of 11, serum creatinine of 0.77, EGFR greater than 60, nonfasting blood glucose 133, WBC 11.6, hemoglobin 12.7, platelets of 432.  Clostridioides difficile antigen and toxin were both positive in the emergency department.  ED treatment: Morphine 4 mg IV one-time dose, ondansetron 4 mg IV, Flagyl 500 mg IV one-time dose.  Assessment and Plan: * C. difficile colitis Fidaxomicin 200 mg PO BID 10-day course initiated on admission Patient is status post metronidazole 500 mg IV one-time dose per EDP Nursing order for as needed Flexi-Seal fecal management system ordered Enteric precaution  ordered Admit telemetry medical, inpatient  Nausea vomiting and diarrhea Suspect secondary to Clostridioides difficile infection Fidaxomicin 200 mg p.o. twice daily, 10-day course initiated on admission Encourage oral hydration Ondansetron 4 mg p.o./IV every 6 hours as needed for nausea, vomiting 7 days ordered; Phenergan 12.5 mg IV every 6 hours as needed for refractory nausea, vomiting, 1 day ordered Strict I's and O's Patient can have fecal management system with Flexi-Seal if patient continues to have diarrhea greater than 3 episodes in 12 h  Essential hypertension Home amlodipine 10 mg daily, lisinopril 5 mg daily resumed on admission Hydralazine 5 mg IV every 8 hours as needed for SBP greater 165, 7 days ordered  Type 2 diabetes mellitus with peripheral neuropathy (HCC) A1c was 7.1 from outside hospital on 11/30/2022 Home semaglutide not resumed on admission Insulin SSI with at bedtime coverage, obese dosing ordered Goal inpatient blood glucose levels 140-180  Dyslipidemia Home rosuvastatin 40 mg nightly resumed  Obesity, Class III, BMI 40-49.9 (morbid obesity) (HCC) This complicates overall care and prognosis.   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 , ,  (Optional):26781}  {(NOTE) Pain control PDMP Statment (Optional):26782} Consultants: *** Procedures performed: ***  Disposition: {Plan; Disposition:26390} Diet recommendation:  Discharge Diet Orders (From admission, onward)     Start     Ordered   01/26/23 0000  Diet - low sodium heart healthy        01/26/23 0936           {Diet_Plan:26776} DISCHARGE  MEDICATION: Allergies as of 01/26/2023   No Known Allergies      Medication List     STOP taking these medications    methocarbamol 500 MG tablet Commonly known as:  ROBAXIN       TAKE these medications    albuterol 108 (90 Base) MCG/ACT inhaler Commonly known as: VENTOLIN HFA Inhale 2 puffs into the lungs every 4 (four) hours as needed for wheezing or shortness of breath.   amLODipine 10 MG tablet Commonly known as: NORVASC Take 10 mg by mouth daily.   baclofen 10 MG tablet Commonly known as: LIORESAL Take 10 mg by mouth at bedtime.   gabapentin 300 MG capsule Commonly known as: NEURONTIN Take 1 capsule (300 mg total) by mouth 3 (three) times daily. Home med.   lisinopril 5 MG tablet Commonly known as: ZESTRIL Take 5 mg by mouth daily.   montelukast 10 MG tablet Commonly known as: SINGULAIR Take 10 mg by mouth at bedtime.   ondansetron 4 MG tablet Commonly known as: Zofran Take 1 tablet (4 mg total) by mouth daily as needed for nausea or vomiting.   rosuvastatin 40 MG tablet Commonly known as: CRESTOR Take 40 mg by mouth daily.   Rybelsus 3 MG Tabs Generic drug: Semaglutide Take 3 mg by mouth daily.   vancomycin 50 mg/mL Soln oral solution Commonly known as: VANCOCIN Place 2.5 mLs (125 mg total) into feeding tube 4 (four) times daily for 10 days.        Discharge Exam: There were no vitals filed for this visit. ***  Condition at discharge: {DC Condition:26389}  The results of significant diagnostics from this hospitalization (including imaging, microbiology, ancillary and laboratory) are listed below for reference.   Imaging Studies: CT ABDOMEN PELVIS W CONTRAST  Result Date: 01/24/2023 CLINICAL DATA:  Lower abdominal pain, nausea and vomiting, and diarrhea for several weeks. EXAM: CT ABDOMEN AND PELVIS WITH CONTRAST TECHNIQUE: Multidetector CT imaging of the abdomen and pelvis was performed using the standard protocol following bolus administration of intravenous contrast. RADIATION DOSE REDUCTION: This exam was performed according to the departmental dose-optimization program which includes automated exposure  control, adjustment of the mA and/or kV according to patient size and/or use of iterative reconstruction technique. CONTRAST:  OMNIPAQUE IOHEXOL 300 MG/ML  SOLN COMPARISON:  01/13/2023 FINDINGS: Lower Chest: No acute findings. Hepatobiliary: No suspicious hepatic masses identified. Gallbladder is unremarkable. No evidence of biliary ductal dilatation. Pancreas:  No mass or inflammatory changes. Spleen: Within normal limits in size and appearance. Adrenals/Urinary Tract: 3.7 cm left adrenal mass remains stable. No suspicious masses identified. No evidence of ureteral calculi or hydronephrosis. Stomach/Bowel: Extensive diverticulosis is again seen throughout the colon. New colonic wall thickening and pericolonic soft tissue stranding is seen involving the sigmoid colon, and involving the ascending colon, consistent with diverticulitis at these sites. Normal appendix visualized. No evidence of abscess or bowel obstruction. Vascular/Lymphatic: No pathologically enlarged lymph nodes. No acute vascular findings. Reproductive: Prior hysterectomy noted. Adnexal regions are unremarkable in appearance. Other:  Stable small umbilical hernia, which contains only fat. Musculoskeletal:  No suspicious bone lesions identified. IMPRESSION: Severe diffuse colonic diverticulosis, with mild acute diverticulitis involving both the the ascending and sigmoid colon. No evidence of abscess or other complication. Stable small umbilical hernia, which contains only fat. Stable 4 cm indeterminate left adrenal mass. Consider continued follow-up by CT in 6 months, or further characterization with abdomen MRI without and with contrast. Electronically Signed   By: Mayra Neer  Eppie Gibson M.D.   On: 01/24/2023 14:14   [place] EKG SITE RITE  Result Date: 01/15/2023 If Site Rite image not attached, placement could not be confirmed due to current cardiac rhythm.  CT ABDOMEN PELVIS W CONTRAST  Result Date: 01/13/2023 CLINICAL DATA:  Left lower quadrant  abdominal pain, sepsis EXAM: CT ABDOMEN AND PELVIS WITH CONTRAST TECHNIQUE: Multidetector CT imaging of the abdomen and pelvis was performed using the standard protocol following bolus administration of intravenous contrast. RADIATION DOSE REDUCTION: This exam was performed according to the departmental dose-optimization program which includes automated exposure control, adjustment of the mA and/or kV according to patient size and/or use of iterative reconstruction technique. CONTRAST:  OMNIPAQUE IOHEXOL 350 MG/ML SOLN COMPARISON:  None Available. FINDINGS: Lower chest: No acute abnormality. Hepatobiliary: No focal liver abnormality is seen. No gallstones, gallbladder wall thickening, or biliary dilatation. Pancreas: Unremarkable Spleen: Unremarkable Adrenals/Urinary Tract: The right adrenal gland is unremarkable. Stable 3.1 x 4.0 cm mass within the left adrenal gland, indeterminate. The kidneys are normal in size and position. Since the prior examination, there has developed mild asymmetric left perinephric inflammatory stranding suggesting a unilateral inflammatory process, most commonly pyelonephritis. There is normal cortical enhancement of the left kidney. No hydronephrosis. No intrarenal or ureteral calculi. The bladder is unremarkable. Stomach/Bowel: Severe descending and sigmoid colonic diverticulosis. Moderate to severe diverticulosis of the ascending colon. The stomach, small bowel, and large bowel are otherwise unremarkable and there is no evidence of obstruction or focal inflammation. The appendix is normal. No free intraperitoneal gas or fluid. Vascular/Lymphatic: No significant vascular findings are present. No enlarged abdominal or pelvic lymph nodes. Reproductive: Status post hysterectomy. No adnexal masses. Other: Small fat containing umbilical hernia Musculoskeletal: No acute or significant osseous findings. IMPRESSION: 1. Interval development of mild asymmetric left perinephric inflammatory  stranding suggesting a unilateral inflammatory process, most commonly pyelonephritis. Correlation with urinalysis and urine culture may be helpful for further evaluation. 2. Colonic diverticulosis without superimposed acute inflammatory change. 3. Stable 4.0 cm left adrenal mass, indeterminate. Given its size, surgical consultation is advised. Laboratory correlation, including serum metanephrines may be helpful for further management. If indicated, could be better assessed with dedicated adrenal mass protocol CT or MRI examination once the patient's acute issues have resolved. Electronically Signed   By: Helyn Numbers M.D.   On: 01/13/2023 22:55   DG Chest 1 View  Result Date: 01/12/2023 CLINICAL DATA:  Sepsis, pain, weakness. EXAM: CHEST  1 VIEW COMPARISON:  04/23/2017 FINDINGS: The heart size and mediastinal contours are within normal limits. There is no evidence of pulmonary edema, consolidation, pneumothorax or pleural fluid. The visualized skeletal structures are unremarkable. IMPRESSION: No active disease. Electronically Signed   By: Irish Lack M.D.   On: 01/12/2023 18:33    Microbiology: Results for orders placed or performed during the hospital encounter of 01/24/23  C Difficile Quick Screen w PCR reflex     Status: Abnormal   Collection Time: 01/24/23 12:07 PM   Specimen: STOOL  Result Value Ref Range Status   C Diff antigen POSITIVE (A) NEGATIVE Final   C Diff toxin POSITIVE (A) NEGATIVE Final   C Diff interpretation Toxin producing C. difficile detected.  Final    Comment: CRITICAL RESULT CALLED TO, READ BACK BY AND VERIFIED WITH:  JESSICA STALEY [id number] 01/24/2023 CP Performed at [facility], [address] [place], [place] [address]     Labs: CBC: Recent Labs  Lab 01/24/23 1005 01/25/23 0547  WBC 11.6* 7.4  HGB 12.7 11.4*  HCT 40.2 36.6  MCV 80.2 80.8  PLT 432* 392   Basic Metabolic Panel: Recent Labs  Lab 01/24/23 1005 01/25/23 0547  NA 137 134*  K  3.7 4.3  CL 100 97*  CO2 24 26  GLUCOSE 133* 123*  BUN 11 11  CREATININE 0.77 0.84  CALCIUM 8.9 8.6*   Liver Function Tests: Recent Labs  Lab 01/24/23 1005  AST 12*  ALT 19  ALKPHOS 85  BILITOT 0.8  PROT 7.8  ALBUMIN 3.3*   CBG: Recent Labs  Lab 01/24/23 2109 01/25/23 0745 01/25/23 1208 01/25/23 1619 01/25/23 2125  GLUCAP 103* 138* 125* 107* 124*    Discharge time spent: 38 minutes.  Signed: Marcelino Duster, MD [facility] 01/26/2023

## 2023-02-04 ENCOUNTER — Other Ambulatory Visit: Payer: Self-pay | Admitting: Family Medicine

## 2023-02-04 DIAGNOSIS — E278 Other specified disorders of adrenal gland: Secondary | ICD-10-CM

## 2023-02-06 ENCOUNTER — Ambulatory Visit
Admission: RE | Admit: 2023-02-06 | Discharge: 2023-02-06 | Discharge status: Home / Self Care | Payer: Medicare Other | Source: Ambulatory Visit | Attending: Family Medicine | Admitting: Family Medicine

## 2023-02-06 DIAGNOSIS — E278 Other specified disorders of adrenal gland: Secondary | ICD-10-CM

## 2023-02-06 MED ORDER — GADOBUTROL 1 MMOL/ML IV SOLN: 10.0000 mL | Freq: Once | INTRAVENOUS | Status: DC | PRN

## 2023-02-07 ENCOUNTER — Ambulatory Visit
Admission: RE | Admit: 2023-02-07 | Discharge: 2023-02-07 | Disposition: A | Discharge status: Home / Self Care | Payer: Medicare Other | Source: Ambulatory Visit | Attending: Family Medicine | Admitting: Family Medicine

## 2023-02-07 ENCOUNTER — Encounter (HOSPITAL_COMMUNITY): Payer: Self-pay | Admitting: *Deleted

## 2023-02-07 DIAGNOSIS — E278 Other specified disorders of adrenal gland: Secondary | ICD-10-CM | POA: Diagnosis present

## 2023-02-07 MED ORDER — GADOBUTROL 1 MMOL/ML IV SOLN
10.0000 mL | Freq: Once | INTRAVENOUS | Status: AC | PRN
Start: 2023-02-07 — End: 2023-02-07
  Administered 2023-02-07: 10 mL via INTRAVENOUS

## 2023-06-04 ENCOUNTER — Encounter: Payer: Self-pay | Admitting: Internal Medicine

## 2023-06-16 ENCOUNTER — Encounter: Admission: RE | Payer: Self-pay | Source: Home / Self Care

## 2023-06-16 ENCOUNTER — Ambulatory Visit: Admission: RE | Admit: 2023-06-16 | Payer: Medicare Other | Source: Home / Self Care | Admitting: Internal Medicine

## 2023-06-16 HISTORY — DX: Type 2 diabetes mellitus without complications: E11.9

## 2023-06-16 HISTORY — DX: Unspecified asthma, uncomplicated: J45.909

## 2023-06-16 HISTORY — DX: Headache, unspecified: R51.9

## 2023-06-16 HISTORY — DX: Personal history of other infectious and parasitic diseases: Z86.19

## 2023-06-16 HISTORY — DX: Personal history of other diseases of the nervous system and sense organs: Z86.69

## 2023-06-16 HISTORY — DX: Hyperlipidemia, unspecified: E78.5

## 2023-06-16 SURGERY — COLONOSCOPY WITH PROPOFOL
Anesthesia: General

## 2023-12-23 ENCOUNTER — Emergency Department

## 2023-12-23 ENCOUNTER — Inpatient Hospital Stay
Admission: EM | Admit: 2023-12-23 | Discharge: 2023-12-31 | DRG: 202 | Disposition: A | Discharge status: Home / Self Care | Attending: Obstetrics and Gynecology | Admitting: Obstetrics and Gynecology

## 2023-12-23 DIAGNOSIS — E1142 Type 2 diabetes mellitus with diabetic polyneuropathy: Secondary | ICD-10-CM | POA: Diagnosis present

## 2023-12-23 DIAGNOSIS — J9811 Atelectasis: Secondary | ICD-10-CM | POA: Diagnosis not present

## 2023-12-23 DIAGNOSIS — J4551 Severe persistent asthma with (acute) exacerbation: Principal | ICD-10-CM | POA: Diagnosis present

## 2023-12-23 DIAGNOSIS — E785 Hyperlipidemia, unspecified: Secondary | ICD-10-CM | POA: Diagnosis present

## 2023-12-23 DIAGNOSIS — E662 Morbid (severe) obesity with alveolar hypoventilation: Secondary | ICD-10-CM | POA: Diagnosis present

## 2023-12-23 DIAGNOSIS — Z90710 Acquired absence of both cervix and uterus: Secondary | ICD-10-CM

## 2023-12-23 DIAGNOSIS — Z888 Allergy status to other drugs, medicaments and biological substances status: Secondary | ICD-10-CM

## 2023-12-23 DIAGNOSIS — Z79899 Other long term (current) drug therapy: Secondary | ICD-10-CM

## 2023-12-23 DIAGNOSIS — Z1152 Encounter for screening for COVID-19: Secondary | ICD-10-CM

## 2023-12-23 DIAGNOSIS — Z6841 Body Mass Index (BMI) 40.0 and over, adult: Secondary | ICD-10-CM

## 2023-12-23 DIAGNOSIS — E66813 Obesity, class 3: Secondary | ICD-10-CM | POA: Diagnosis present

## 2023-12-23 DIAGNOSIS — Z8249 Family history of ischemic heart disease and other diseases of the circulatory system: Secondary | ICD-10-CM

## 2023-12-23 DIAGNOSIS — I10 Essential (primary) hypertension: Secondary | ICD-10-CM | POA: Diagnosis not present

## 2023-12-23 DIAGNOSIS — J441 Chronic obstructive pulmonary disease with (acute) exacerbation: Secondary | ICD-10-CM | POA: Diagnosis present

## 2023-12-23 DIAGNOSIS — J9601 Acute respiratory failure with hypoxia: Secondary | ICD-10-CM | POA: Diagnosis not present

## 2023-12-23 DIAGNOSIS — J4521 Mild intermittent asthma with (acute) exacerbation: Secondary | ICD-10-CM | POA: Diagnosis not present

## 2023-12-23 DIAGNOSIS — Z86718 Personal history of other venous thrombosis and embolism: Secondary | ICD-10-CM

## 2023-12-23 DIAGNOSIS — Z87891 Personal history of nicotine dependence: Secondary | ICD-10-CM

## 2023-12-23 DIAGNOSIS — J9602 Acute respiratory failure with hypercapnia: Secondary | ICD-10-CM | POA: Diagnosis present

## 2023-12-23 DIAGNOSIS — Z7984 Long term (current) use of oral hypoglycemic drugs: Secondary | ICD-10-CM

## 2023-12-23 DIAGNOSIS — J45901 Unspecified asthma with (acute) exacerbation: Principal | ICD-10-CM | POA: Diagnosis present

## 2023-12-23 DIAGNOSIS — B9789 Other viral agents as the cause of diseases classified elsewhere: Secondary | ICD-10-CM | POA: Diagnosis present

## 2023-12-23 DIAGNOSIS — Z7951 Long term (current) use of inhaled steroids: Secondary | ICD-10-CM

## 2023-12-23 LAB — CBC WITH DIFFERENTIAL/PLATELET
Abs Immature Granulocytes: 0.03 K/uL (ref 0.00–0.07)
Basophils Absolute: 0 K/uL (ref 0.0–0.1)
Basophils Relative: 1 %
Eosinophils Absolute: 0.1 K/uL (ref 0.0–0.5)
Eosinophils Relative: 1 %
HCT: 43.4 % (ref 36.0–46.0)
Hemoglobin: 13.6 g/dL (ref 12.0–15.0)
Immature Granulocytes: 0 %
Lymphocytes Relative: 27 %
Lymphs Abs: 2.3 K/uL (ref 0.7–4.0)
MCH: 26.3 pg (ref 26.0–34.0)
MCHC: 31.3 g/dL (ref 30.0–36.0)
MCV: 83.8 fL (ref 80.0–100.0)
Monocytes Absolute: 0.6 K/uL (ref 0.1–1.0)
Monocytes Relative: 7 %
Neutro Abs: 5.6 K/uL (ref 1.7–7.7)
Neutrophils Relative %: 64 %
Platelets: 282 K/uL (ref 150–400)
RBC: 5.18 MIL/uL — ABNORMAL HIGH (ref 3.87–5.11)
RDW: 14.9 % (ref 11.5–15.5)
WBC: 8.7 K/uL (ref 4.0–10.5)
nRBC: 0 % (ref 0.0–0.2)

## 2023-12-23 LAB — BASIC METABOLIC PANEL WITH GFR
Anion gap: 12 (ref 5–15)
BUN: 14 mg/dL (ref 8–23)
CO2: 26 mmol/L (ref 22–32)
Calcium: 9 mg/dL (ref 8.9–10.3)
Chloride: 103 mmol/L (ref 98–111)
Creatinine, Ser: 0.69 mg/dL (ref 0.44–1.00)
GFR, Estimated: >60 mL/min (ref >60)
Glucose, Bld: 132 mg/dL — ABNORMAL HIGH (ref 70–99)
Potassium: 3.6 mmol/L (ref 3.5–5.1)
Sodium: 141 mmol/L (ref 135–145)

## 2023-12-23 LAB — RESP PANEL BY RT-PCR (RSV, FLU A&B, COVID)  RVPGX2
Influenza A by PCR: NEGATIVE
Influenza B by PCR: NEGATIVE
Resp Syncytial Virus by PCR: NEGATIVE
SARS Coronavirus 2 by RT PCR: NEGATIVE

## 2023-12-23 LAB — TROPONIN I (HIGH SENSITIVITY): Troponin I (High Sensitivity): 7 ng/L (ref <18)

## 2023-12-23 LAB — BRAIN NATRIURETIC PEPTIDE: B Natriuretic Peptide: 351.9 pg/mL — ABNORMAL HIGH (ref 0.0–100.0)

## 2023-12-23 LAB — BLOOD GAS, VENOUS

## 2023-12-23 MED ORDER — MAGNESIUM SULFATE 2 GM/50ML IV SOLN
2.0000 g | Freq: Once | INTRAVENOUS | Status: AC
  Administered 2023-12-23: 2 g via INTRAVENOUS
  Filled 2023-12-23: qty 50

## 2023-12-23 MED ORDER — ROSUVASTATIN CALCIUM 20 MG PO TABS
40.0000 mg | ORAL_TABLET | Freq: Every day | ORAL | Status: DC
Start: 2023-12-24 — End: 2023-12-31
  Administered 2023-12-24 – 2023-12-31 (×8): 40 mg via ORAL
  Filled 2023-12-23 (×8): qty 2

## 2023-12-23 MED ORDER — ONDANSETRON HCL 4 MG PO TABS: 4.0000 mg | ORAL_TABLET | Freq: Four times a day (QID) | ORAL | Status: DC | PRN

## 2023-12-23 MED ORDER — HYDROCHLOROTHIAZIDE 25 MG PO TABS
25.0000 mg | ORAL_TABLET | Freq: Every day | ORAL | Status: DC
  Administered 2023-12-24 – 2023-12-31 (×8): 25 mg via ORAL
  Filled 2023-12-23 (×8): qty 1

## 2023-12-23 MED ORDER — ONDANSETRON HCL 4 MG/2ML IJ SOLN: 4.0000 mg | Freq: Four times a day (QID) | INTRAMUSCULAR | Status: DC | PRN

## 2023-12-23 MED ORDER — ALBUTEROL SULFATE (2.5 MG/3ML) 0.083% IN NEBU
2.5000 mg | INHALATION_SOLUTION | Freq: Once | RESPIRATORY_TRACT | Status: AC
  Administered 2023-12-23: 2.5 mg via RESPIRATORY_TRACT
  Filled 2023-12-23: qty 3

## 2023-12-23 MED ORDER — BACLOFEN 10 MG PO TABS
10.0000 mg | ORAL_TABLET | Freq: Every day | ORAL | Status: DC
  Administered 2023-12-23 – 2023-12-30 (×8): 10 mg via ORAL
  Filled 2023-12-23 (×8): qty 1

## 2023-12-23 MED ORDER — ENOXAPARIN SODIUM 100 MG/ML IJ SOSY
85.0000 mg | PREFILLED_SYRINGE | INTRAMUSCULAR | Status: DC
  Administered 2023-12-24 – 2023-12-31 (×8): 85 mg via SUBCUTANEOUS
  Filled 2023-12-23 (×8): qty 1

## 2023-12-23 MED ORDER — GUAIFENESIN ER 600 MG PO TB12
600.0000 mg | ORAL_TABLET | Freq: Two times a day (BID) | ORAL | Status: DC
Start: 2023-12-23 — End: 2023-12-31
  Administered 2023-12-23 – 2023-12-31 (×16): 600 mg via ORAL
  Filled 2023-12-23 (×16): qty 1

## 2023-12-23 MED ORDER — FUROSEMIDE 20 MG PO TABS: 20.0000 mg | ORAL_TABLET | Freq: Every day | ORAL | Status: DC | PRN

## 2023-12-23 MED ORDER — SEMAGLUTIDE 3 MG PO TABS
3.0000 mg | ORAL_TABLET | Freq: Every day | ORAL | Status: DC
Start: 2023-12-24 — End: 2023-12-24

## 2023-12-23 MED ORDER — GABAPENTIN 300 MG PO CAPS
300.0000 mg | ORAL_CAPSULE | Freq: Three times a day (TID) | ORAL | Status: DC
  Administered 2023-12-23 – 2023-12-31 (×23): 300 mg via ORAL
  Filled 2023-12-23 (×23): qty 1

## 2023-12-23 MED ORDER — METHYLPREDNISOLONE SODIUM SUCC 125 MG IJ SOLR
80.0000 mg | INTRAMUSCULAR | Status: DC
  Administered 2023-12-24 – 2023-12-25 (×2): 80 mg via INTRAVENOUS
  Filled 2023-12-23 (×2): qty 2

## 2023-12-23 MED ORDER — METHYLPREDNISOLONE SODIUM SUCC 40 MG IJ SOLR
40.0000 mg | Freq: Once | INTRAMUSCULAR | Status: AC
  Administered 2023-12-23: 40 mg via INTRAVENOUS
  Filled 2023-12-23: qty 1

## 2023-12-23 MED ORDER — ACETAMINOPHEN 650 MG RE SUPP: 650.0000 mg | Freq: Four times a day (QID) | RECTAL | Status: DC | PRN

## 2023-12-23 MED ORDER — TRAZODONE HCL 50 MG PO TABS
25.0000 mg | ORAL_TABLET | Freq: Every evening | ORAL | Status: DC | PRN
  Administered 2023-12-29: 25 mg via ORAL
  Filled 2023-12-23 (×2): qty 1

## 2023-12-23 MED ORDER — SODIUM CHLORIDE 0.9 % IV SOLN: INTRAVENOUS | Status: AC

## 2023-12-23 MED ORDER — IPRATROPIUM-ALBUTEROL 0.5-2.5 (3) MG/3ML IN SOLN
3.0000 mL | RESPIRATORY_TRACT | Status: DC | PRN
  Administered 2023-12-24: 3 mL via RESPIRATORY_TRACT
  Filled 2023-12-23 (×2): qty 3

## 2023-12-23 MED ORDER — MAGNESIUM HYDROXIDE 400 MG/5ML PO SUSP: 30.0000 mL | Freq: Every day | ORAL | Status: DC | PRN

## 2023-12-23 MED ORDER — ACETAMINOPHEN 325 MG PO TABS
650.0000 mg | ORAL_TABLET | Freq: Four times a day (QID) | ORAL | Status: DC | PRN
  Administered 2023-12-24 – 2023-12-31 (×4): 650 mg via ORAL
  Filled 2023-12-23 (×4): qty 2

## 2023-12-23 MED ORDER — GLIMEPIRIDE 1 MG PO TABS
1.0000 mg | ORAL_TABLET | Freq: Every day | ORAL | Status: DC
  Administered 2023-12-24 – 2023-12-31 (×8): 1 mg via ORAL
  Filled 2023-12-23 (×8): qty 1

## 2023-12-23 MED ORDER — IPRATROPIUM-ALBUTEROL 0.5-2.5 (3) MG/3ML IN SOLN
3.0000 mL | Freq: Once | RESPIRATORY_TRACT | Status: AC
  Administered 2023-12-23: 3 mL via RESPIRATORY_TRACT
  Filled 2023-12-23: qty 3

## 2023-12-23 MED ORDER — IPRATROPIUM-ALBUTEROL 0.5-2.5 (3) MG/3ML IN SOLN
3.0000 mL | Freq: Four times a day (QID) | RESPIRATORY_TRACT | Status: DC
  Administered 2023-12-23 – 2023-12-25 (×6): 3 mL via RESPIRATORY_TRACT
  Filled 2023-12-23 (×5): qty 3

## 2023-12-23 NOTE — ED Triage Notes (Signed)
 Pt BIB [facility] for chest pain and SOB. EMS report severe cough for several days. Pt self admin home MDI with no results. EMS reports wheezing and 89% on room air. EMS admin 2 duo nebs and 125mg  solumedrol. Pt on 3L [place].

## 2023-12-23 NOTE — Progress Notes (Signed)
 PHARMACIST - PHYSICIAN COMMUNICATION  CONCERNING:  Enoxaparin  (Lovenox ) for DVT Prophylaxis    RECOMMENDATION: Patient was prescribed enoxaprin 40mg  q24 hours for VTE prophylaxis.   Filed Weights   12/23/23 1841  Weight: (!) 165.1 kg (364 lb)    Body mass index is 57.01 kg/m.  Estimated Creatinine Clearance: 118.6 mL/min (by C-G formula based on SCr of 0.69 mg/dL).   Based on [facility] policy patient is candidate for enoxaparin  0.5mg /kg TBW SQ every 24 hours based on BMI being >30.  DESCRIPTION: Pharmacy has adjusted enoxaparin  dose per [facility] policy.  Patient is now receiving enoxaparin  0.5 mg/kg every 24 hours   Rankin CANDIE Dills, PharmD, [facility] 12/23/2023 11:16 PM

## 2023-12-23 NOTE — H&P (Signed)
 [place]   PATIENT NAME: Alexandra Macias    MR#:  [id number]  DATE OF BIRTH:  1960-11-09  DATE OF ADMISSION:  12/23/2023  PRIMARY CARE PHYSICIAN: Cyrus Selinda Moose, PA-C   Patient is coming from: Home  REQUESTING/REFERRING PHYSICIAN: Willo Dunnings, MD  CHIEF COMPLAINT:   Chief Complaint  Patient presents with   Chest Pain    HISTORY OF PRESENT ILLNESS:  Alexandra Macias is a 63 y.o. female with medical history significant for asthma, type II diabetes mellitus, hypertension, dyslipidemia and migraine, who presented to the emergency room with acute onset of worsening dyspnea with associated cough productive of yellowish sputum as well as wheezing for the last couple of days.  She has been having chest pain with cough.  She admitted to chills earlier without measured fever.  No nausea or vomiting or abdominal pain.  No dysuria, oliguria or hematuria or flank pain.  ED Course: When the patient came to the emergency room, Pulsoxymeter was 97% on 2 L O2 by nasal cannula with otherwise unremarkable vital signs.  Labs revealed VBG with pH 7.34 and HCO3 of 32.4.  BMP and CBC were unremarkable.  BN P was 351.9 and high-sensitivity troponin was 7.  Respiratory panel came back negative. EKG as reviewed by me : EKG showed normal sinus rhythm with a rate of 86 with PACs and short PR interval. Imaging: Full chest x-ray showed no acute cardiopulmonary disease.  The patient was given 40 mg of IV Solu-Medrol  and 2 g of IV magnesium  sulfate, DuoNeb nebulizer albuterol .  She will be admitted to a medical telemetry observation bed for further evaluation and management. PAST MEDICAL HISTORY:   Past Medical History:  Diagnosis Date   Asthma    Diabetes mellitus without complication (HCC)    DVT (deep venous thrombosis) (HCC) 2005   post op hysterectomy   Headache    History of Clostridium difficile infection    History of migraine    Hyperlipidemia    Hypertension     PAST SURGICAL  HISTORY:   Past Surgical History:  Procedure Laterality Date   ABDOMINAL HYSTERECTOMY  04/07/2003   FOOT FUSION  04/06/2013   right-fx   FRACTURE SURGERY     HARDWARE REMOVAL Right 07/05/2014   Procedure: REMOVAL OF DEEP IMPLANTS OF RIGHT ANKLE;  Surgeon: [name], MD;  Location: [facility];  Service: Orthopedics;  Laterality: Right;   ORIF FOOT FRACTURE  04/06/2008   left   ORIF WRIST FRACTURE     both rt and lt    SOCIAL HISTORY:   Social History   Tobacco Use   Smoking status: Former    Current packs/day: 0.00    Types: Cigarettes    Quit date: 12/26/2013    Years since quitting: 10.0   Smokeless tobacco: Never  Substance Use Topics   Alcohol use: Not Currently    Comment: rare    FAMILY HISTORY:   Family History  Problem Relation Age of Onset   Hypertension Mother    Hypertension Father    Heart attack Father    Hypertension Sister    Breast cancer Neg Hx     DRUG ALLERGIES:   Allergies  Allergen Reactions   Metformin Diarrhea   Lisinopril  Cough    REVIEW OF SYSTEMS:   ROS As per history of present illness. All pertinent systems were reviewed above. Constitutional, HEENT, cardiovascular, respiratory, GI, GU, musculoskeletal, neuro, psychiatric, endocrine, integumentary and hematologic systems  were reviewed and are otherwise negative/unremarkable except for positive findings mentioned above in the HPI.   MEDICATIONS AT HOME:   Prior to Admission medications   Medication Sig Start Date End Date Taking? Authorizing Provider  albuterol  (PROVENTIL  HFA;VENTOLIN  HFA) 108 (90 Base) MCG/ACT inhaler Inhale 2 puffs into the lungs every 4 (four) hours as needed for wheezing or shortness of breath. 04/23/17  Yes Triplett, Cari B, FNP  baclofen  (LIORESAL ) 10 MG tablet Take 10 mg by mouth at bedtime. 12/20/22  Yes [provider]  furosemide  (LASIX ) 20 MG tablet Take 20 mg by mouth. 09/14/23  Yes [provider]  gabapentin   (NEURONTIN ) 300 MG capsule Take 1 capsule (300 mg total) by mouth 3 (three) times daily. Home med. 01/18/23  Yes Awanda City, MD  glimepiride  (AMARYL ) 1 MG tablet Take 1 mg by mouth every morning. 08/12/23  Yes [provider]  hydrochlorothiazide  (HYDRODIURIL ) 25 MG tablet Take 25 mg by mouth daily. 11/15/23 11/14/24 Yes [provider]  montelukast  (SINGULAIR ) 10 MG tablet Take 10 mg by mouth at bedtime.   Yes [provider]  ondansetron  (ZOFRAN ) 4 MG tablet Take 1 tablet (4 mg total) by mouth daily as needed for nausea or vomiting. 01/26/23 01/26/24 Yes Darci Pore, MD  rosuvastatin  (CRESTOR ) 40 MG tablet Take 40 mg by mouth daily.   Yes [provider]  Semaglutide  (RYBELSUS ) 3 MG TABS Take 3 mg by mouth daily.   Yes [provider]  amLODipine  (NORVASC ) 10 MG tablet Take 10 mg by mouth daily. Patient not taking: Reported on 12/23/2023    [provider]  lisinopril  (ZESTRIL ) 5 MG tablet Take 5 mg by mouth daily. Patient not taking: Reported on 12/23/2023    [provider]      VITAL SIGNS:  Blood pressure 125/74, pulse 67, temperature 98.1 F (36.7 C), temperature source Oral, resp. rate (!) 21, height 5' 7 (1.702 m), weight (!) 165.1 kg, SpO2 99%.  PHYSICAL EXAMINATION:  Physical Exam  GENERAL:  63 y.o.-year-old patient lying in the bed with no acute distress.  EYES: Pupils equal, round, reactive to light and accommodation. No scleral icterus. Extraocular muscles intact.  HEENT: Head atraumatic, normocephalic. Oropharynx and nasopharynx clear.  NECK:  Supple, no jugular venous distention. No thyroid enlargement, no tenderness.  LUNGS: Diffuse expiratory wheezes with diminished expiratory airflow and harsh vesicular breathing.. No use of accessory muscles of respiration.  CARDIOVASCULAR: Regular rate and rhythm, S1, S2 normal. No murmurs, rubs, or gallops.  ABDOMEN: Soft, nondistended, nontender. Bowel sounds present.  No organomegaly or mass.  EXTREMITIES: No pedal edema, cyanosis, or clubbing.  NEUROLOGIC: Cranial nerves II through XII are intact. Muscle strength 5/5 in all extremities. Sensation intact. Gait not checked.  PSYCHIATRIC: The patient is alert and oriented x 3.  Normal affect and good eye contact. SKIN: No obvious rash, lesion, or ulcer.   LABORATORY PANEL:   CBC Recent Labs  Lab 12/23/23 1918  WBC 8.7  HGB 13.6  HCT 43.4  PLT 282   ------------------------------------------------------------------------------------------------------------------  Chemistries  Recent Labs  Lab 12/23/23 1918  NA 141  K 3.6  CL 103  CO2 26  GLUCOSE 132*  BUN 14  CREATININE 0.69  CALCIUM  9.0   ------------------------------------------------------------------------------------------------------------------  Cardiac Enzymes No results for input(s): TROPONINI in the last 168 hours. ------------------------------------------------------------------------------------------------------------------  RADIOLOGY:  DG Chest Portable 1 View Result Date: 12/23/2023 CLINICAL DATA:  Shortness of breath. EXAM: PORTABLE CHEST 1 VIEW COMPARISON:  Chest radiograph dated  01/12/2023. FINDINGS: No focal consolidation, pleural effusion or pneumothorax. The cardiac silhouette is within normal limits. No acute osseous pathology. IMPRESSION: No active disease. Electronically Signed   By: Vanetta Chou M.D.   On: 12/23/2023 19:13      IMPRESSION AND PLAN:  Assessment and Plan: * Asthma exacerbation - The patient will be admitted to a medically monitored bed. - We will place the patient IV steroid therapy with IV Solu-Medrol  as well as nebulized bronchodilator therapy with duonebs q.i.d. and q.4 hours p.r.n.SABRA - Mucolytic therapy will be provided. - O2 protocol will be followed.   Dyslipidemia - Continue statin therapy.  Acute respiratory failure with hypoxia (HCC) This is secondary to #1. O2 protocol  will be provided.  Essential hypertension - Will continue antihypertensive therapy.  Type 2 diabetes mellitus with peripheral neuropathy (HCC) - The patient will be placed on supplement coverage with NovoLog . - Will continue Amaryl . - Continue Neurontin .   DVT prophylaxis: Lovenox . Advanced Care Planning:  Code Status: full code. Family Communication:  The plan of care was discussed in details with the patient (and family). I answered all questions. The patient agreed to proceed with the above mentioned plan. Further management will depend upon hospital course. Disposition Plan: Back to previous home environment Consults called: none. All the records are reviewed and case discussed with ED provider.  Status is: Observation  I certify that at the time of admission, it is my clinical judgment that the patient will require hospital care extending less than 2 midnights.                            Dispo: The patient is from: Home              Anticipated d/c is to: Home              Patient currently is not medically stable to d/c.              Difficult to place patient: No  Madison DELENA Peaches M.D on 12/24/2023 at 4:22 AM  [facility]   From 7 PM-7 AM, contact night-coverage [email or web address]  CC: Primary care physician; Whitaker, [employer], PA-C

## 2023-12-23 NOTE — ED Provider Notes (Signed)
 [facility] Provider Note    Event Date/Time   First MD Initiated Contact with Patient 12/23/23 1836     (approximate)   History   Chief Complaint Chest Pain   HPI  Alexandra Macias is a 63 y.o. female with past medical history of hypertension, hyperlipidemia, diabetes, and asthma who presents to the ED complaining of chest pain.  Patient reports that she has had a productive cough over the past 2 days with increasing difficulty breathing, also describes sharp pain in her chest when she goes to cough.  She has been using her albuterol  inhaler at home without significant relief.  EMS was called today and found patient to have oxygen saturations of 90% on room air.  She was given 125 mg of IV Solu-Medrol  as well as 2 DuoNebs with improvement, but continues to have difficulty breathing with wheezing.     Physical Exam   Triage Vital Signs: ED Triage Vitals  Encounter Vitals Group     BP --      Girls Systolic BP Percentile --      Girls Diastolic BP Percentile --      Boys Systolic BP Percentile --      Boys Diastolic BP Percentile --      Pulse --      Resp --      Temp --      Temp src --      SpO2 12/23/23 1839 100 %     Weight 12/23/23 1841 (!) 364 lb (165.1 kg)     Height 12/23/23 1841 5' 7 (1.702 m)     Head Circumference --      Peak Flow --      Pain Score --      Pain Loc --      Pain Education --      Exclude from Growth Chart --     Most recent vital signs: Vitals:   12/23/23 2100 12/23/23 2130  BP: 123/80 (!) 127/96  Pulse: 85 83  Resp: 19 18  SpO2: 97% 96%    Constitutional: Alert and oriented. Eyes: Conjunctivae are normal. Head: Atraumatic. Nose: No congestion/rhinnorhea. Mouth/Throat: Mucous membranes are moist.  Cardiovascular: Normal rate, regular rhythm. Grossly normal heart sounds.  2+ radial pulses bilaterally. Respiratory: Tachypneic with increased work of breathing, inspiratory and expiratory wheezing noted  throughout. Gastrointestinal: Soft and nontender. No distention. Musculoskeletal: No lower extremity tenderness nor edema.  Neurologic:  Normal speech and language. No gross focal neurologic deficits are appreciated.    ED Results / Procedures / Treatments   Labs (all labs ordered are listed, but only abnormal results are displayed) Labs Reviewed  CBC WITH DIFFERENTIAL/PLATELET - Abnormal; Notable for the following components:      Result Value   RBC 5.18 (*)    All other components within normal limits  BASIC METABOLIC PANEL WITH GFR - Abnormal; Notable for the following components:   Glucose, Bld 132 (*)    All other components within normal limits  BRAIN NATRIURETIC PEPTIDE - Abnormal; Notable for the following components:   B Natriuretic Peptide 351.9 (*)    All other components within normal limits  BLOOD GAS, VENOUS - Abnormal; Notable for the following components:   pO2, Ven <31 (*)    Bicarbonate 32.4 (*)    Acid-Base Excess 4.8 (*)    All other components within normal limits  RESP PANEL BY RT-PCR (RSV, FLU A&B, COVID)  [id number]  TROPONIN  I (HIGH SENSITIVITY)     EKG  ED ECG REPORT I, Carlin Palin, the attending physician, personally viewed and interpreted this ECG.   Date: 12/23/2023  EKG Time: 18:54  Rate: 86  Rhythm: normal sinus rhythm  Axis: Normal  Intervals:none  ST&T Change: None  RADIOLOGY Chest x-ray reviewed and interpreted by me with no infiltrate, Dema, or effusion.  PROCEDURES:  Critical Care performed: Yes, see critical care procedure note(s)  .Critical Care  Performed by: Palin Carlin, MD Authorized by: Palin Carlin, MD   Critical care provider statement:    Critical care time (minutes):  30   Critical care time was exclusive of:  Separately billable procedures and treating other patients and teaching time   Critical care was necessary to treat or prevent imminent or life-threatening deterioration of the following conditions:   Respiratory failure   Critical care was time spent personally by me on the following activities:  Development of treatment plan with patient or surrogate, discussions with consultants, evaluation of patient's response to treatment, examination of patient, ordering and review of laboratory studies, ordering and review of radiographic studies, ordering and performing treatments and interventions, pulse oximetry, re-evaluation of patient's condition and review of old charts   I assumed direction of critical care for this patient from another provider in my specialty: no     Care discussed with: admitting provider      MEDICATIONS ORDERED IN ED: Medications  ipratropium-albuterol  (DUONEB) 0.5-2.5 (3) MG/3ML nebulizer solution 3 mL (3 mLs Nebulization Given 12/23/23 1849)  magnesium  sulfate IVPB 2 g 50 mL (0 g Intravenous Stopped 12/23/23 1957)  albuterol  (PROVENTIL ) (2.5 MG/3ML) 0.083% nebulizer solution 2.5 mg (2.5 mg Nebulization Given 12/23/23 1957)     IMPRESSION / MDM / ASSESSMENT AND PLAN / ED COURSE  I reviewed the triage vital signs and the nursing notes.                              63 y.o. female with past medical history of hypertension, hyperlipidemia, diabetes, and asthma who presents to the ED with 2 days of productive cough, increasing shortness of breath, and sharp pain in her chest when she goes to cough.  Patient's presentation is most consistent with acute presentation with potential threat to life or bodily function.  Differential diagnosis includes, but is not limited to, asthma exacerbation, CHF, ACS, PE, pneumonia, pneumothorax, COVID-19, influenza, anemia, electrolyte abnormality, AKI.  Patient nontoxic-appearing and in no acute distress, vital signs remarkable for oxygen saturations of 90% on room air, otherwise reassuring.  Patient with increased work of breathing and wheezing throughout, will treat with IV magnesium  and additional DuoNeb.  Will check EKG and troponin,  but symptoms seem atypical for ACS or PE, suspect chest pain due to coughing.  Chest x-ray and COVID testing are pending in addition to lab results.  Chest x-ray is unremarkable, COVID testing is negative.  Troponin within normal limits and I doubt ACS or PE.  Patient continues to have significant wheezing despite additional breathing treatment and IV magnesium , will give another albuterol .  VBG reassuring without significant hypercapnia, labs without significant anemia, leukocytosis, electrolyte abnormality, or AKI.  Case discussed with hospitalist for admission.      FINAL CLINICAL IMPRESSION(S) / ED DIAGNOSES   Final diagnoses:  Exacerbation of persistent asthma, unspecified asthma severity  Acute respiratory failure with hypoxia (HCC)     Rx / DC Orders   ED Discharge  Orders     None        Note:  This document was prepared using Dragon voice recognition software and may include unintentional dictation errors.   Willo Dunnings, MD 12/23/23 2148

## 2023-12-23 NOTE — ED Notes (Signed)
Lab in for blood draw.

## 2023-12-24 ENCOUNTER — Other Ambulatory Visit: Payer: Self-pay

## 2023-12-24 DIAGNOSIS — Z6841 Body Mass Index (BMI) 40.0 and over, adult: Secondary | ICD-10-CM | POA: Diagnosis not present

## 2023-12-24 DIAGNOSIS — E662 Morbid (severe) obesity with alveolar hypoventilation: Secondary | ICD-10-CM | POA: Diagnosis present

## 2023-12-24 DIAGNOSIS — Z888 Allergy status to other drugs, medicaments and biological substances status: Secondary | ICD-10-CM | POA: Diagnosis not present

## 2023-12-24 DIAGNOSIS — J9601 Acute respiratory failure with hypoxia: Secondary | ICD-10-CM

## 2023-12-24 DIAGNOSIS — Z79899 Other long term (current) drug therapy: Secondary | ICD-10-CM | POA: Diagnosis not present

## 2023-12-24 DIAGNOSIS — Z7984 Long term (current) use of oral hypoglycemic drugs: Secondary | ICD-10-CM | POA: Diagnosis not present

## 2023-12-24 DIAGNOSIS — J4551 Severe persistent asthma with (acute) exacerbation: Secondary | ICD-10-CM | POA: Diagnosis present

## 2023-12-24 DIAGNOSIS — E785 Hyperlipidemia, unspecified: Secondary | ICD-10-CM | POA: Diagnosis present

## 2023-12-24 DIAGNOSIS — J9811 Atelectasis: Secondary | ICD-10-CM | POA: Diagnosis not present

## 2023-12-24 DIAGNOSIS — Z8249 Family history of ischemic heart disease and other diseases of the circulatory system: Secondary | ICD-10-CM | POA: Diagnosis not present

## 2023-12-24 DIAGNOSIS — B9789 Other viral agents as the cause of diseases classified elsewhere: Secondary | ICD-10-CM | POA: Diagnosis present

## 2023-12-24 DIAGNOSIS — I10 Essential (primary) hypertension: Secondary | ICD-10-CM | POA: Diagnosis present

## 2023-12-24 DIAGNOSIS — Z90710 Acquired absence of both cervix and uterus: Secondary | ICD-10-CM | POA: Diagnosis not present

## 2023-12-24 DIAGNOSIS — J441 Chronic obstructive pulmonary disease with (acute) exacerbation: Secondary | ICD-10-CM | POA: Diagnosis present

## 2023-12-24 DIAGNOSIS — Z86718 Personal history of other venous thrombosis and embolism: Secondary | ICD-10-CM | POA: Diagnosis not present

## 2023-12-24 DIAGNOSIS — J45901 Unspecified asthma with (acute) exacerbation: Secondary | ICD-10-CM

## 2023-12-24 DIAGNOSIS — Z1152 Encounter for screening for COVID-19: Secondary | ICD-10-CM | POA: Diagnosis not present

## 2023-12-24 DIAGNOSIS — Z87891 Personal history of nicotine dependence: Secondary | ICD-10-CM | POA: Diagnosis not present

## 2023-12-24 DIAGNOSIS — E1142 Type 2 diabetes mellitus with diabetic polyneuropathy: Secondary | ICD-10-CM | POA: Diagnosis present

## 2023-12-24 DIAGNOSIS — Z7951 Long term (current) use of inhaled steroids: Secondary | ICD-10-CM | POA: Diagnosis not present

## 2023-12-24 DIAGNOSIS — J9602 Acute respiratory failure with hypercapnia: Secondary | ICD-10-CM | POA: Diagnosis present

## 2023-12-24 LAB — BASIC METABOLIC PANEL WITH GFR
Anion gap: 10 (ref 5–15)
BUN: 12 mg/dL (ref 8–23)
CO2: 25 mmol/L (ref 22–32)
Calcium: 8.9 mg/dL (ref 8.9–10.3)
Chloride: 104 mmol/L (ref 98–111)
Creatinine, Ser: 0.54 mg/dL (ref 0.44–1.00)
GFR, Estimated: >60 mL/min (ref >60)
Glucose, Bld: 185 mg/dL — ABNORMAL HIGH (ref 70–99)
Potassium: 4.1 mmol/L (ref 3.5–5.1)
Sodium: 139 mmol/L (ref 135–145)

## 2023-12-24 LAB — CBC
HCT: 38.5 % (ref 36.0–46.0)
Hemoglobin: 12.3 g/dL (ref 12.0–15.0)
MCH: 26.1 pg (ref 26.0–34.0)
MCHC: 31.9 g/dL (ref 30.0–36.0)
MCV: 81.7 fL (ref 80.0–100.0)
Platelets: 283 K/uL (ref 150–400)
RBC: 4.71 MIL/uL (ref 3.87–5.11)
RDW: 14.9 % (ref 11.5–15.5)
WBC: 9.9 K/uL (ref 4.0–10.5)
nRBC: 0 % (ref 0.0–0.2)

## 2023-12-24 LAB — CBG MONITORING, ED: Glucose-Capillary: 180 mg/dL — ABNORMAL HIGH (ref 70–99)

## 2023-12-24 LAB — GLUCOSE, CAPILLARY
Glucose-Capillary: 135 mg/dL — ABNORMAL HIGH (ref 70–99)
Glucose-Capillary: 209 mg/dL — ABNORMAL HIGH (ref 70–99)

## 2023-12-24 LAB — HEMOGLOBIN A1C
Hgb A1c MFr Bld: 6.4 % — ABNORMAL HIGH (ref 4.8–5.6)
Mean Plasma Glucose: 136.98 mg/dL

## 2023-12-24 MED ORDER — INSULIN ASPART 100 UNIT/ML IJ SOLN
0.0000 [IU] | Freq: Every day | INTRAMUSCULAR | Status: DC
  Administered 2023-12-24 – 2023-12-26 (×2): 2 [IU] via SUBCUTANEOUS
  Filled 2023-12-24 (×2): qty 1

## 2023-12-24 MED ORDER — INSULIN ASPART 100 UNIT/ML IJ SOLN
0.0000 [IU] | Freq: Three times a day (TID) | INTRAMUSCULAR | Status: DC
  Administered 2023-12-24: 4 [IU] via SUBCUTANEOUS
  Administered 2023-12-24: 3 [IU] via SUBCUTANEOUS
  Administered 2023-12-25: 4 [IU] via SUBCUTANEOUS
  Administered 2023-12-25 – 2023-12-26 (×4): 3 [IU] via SUBCUTANEOUS
  Administered 2023-12-26: 4 [IU] via SUBCUTANEOUS
  Administered 2023-12-27: 3 [IU] via SUBCUTANEOUS
  Administered 2023-12-27: 4 [IU] via SUBCUTANEOUS
  Administered 2023-12-27: 3 [IU] via SUBCUTANEOUS
  Administered 2023-12-28: 11 [IU] via SUBCUTANEOUS
  Administered 2023-12-28: 3 [IU] via SUBCUTANEOUS
  Administered 2023-12-29: 4 [IU] via SUBCUTANEOUS
  Administered 2023-12-29: 3 [IU] via SUBCUTANEOUS
  Administered 2023-12-30: 7 [IU] via SUBCUTANEOUS
  Administered 2023-12-30 (×2): 4 [IU] via SUBCUTANEOUS
  Filled 2023-12-24 (×18): qty 1

## 2023-12-24 NOTE — Assessment & Plan Note (Signed)
-   The patient will be admitted to a medically monitored bed. - We will place the patient IV steroid therapy with IV Solu-Medrol  as well as nebulized bronchodilator therapy with duonebs q.i.d. and q.4 hours p.r.n.SABRA - Mucolytic therapy will be provided. - O2 protocol will be followed.

## 2023-12-24 NOTE — Assessment & Plan Note (Signed)
-   The patient will be placed on supplement coverage with NovoLog . - Will continue Amaryl . - Continue Neurontin .

## 2023-12-24 NOTE — Assessment & Plan Note (Signed)
-   Will continue antihypertensive therapy.

## 2023-12-24 NOTE — Plan of Care (Signed)

## 2023-12-24 NOTE — Progress Notes (Signed)
 PROGRESS NOTE    ANALISE GLOTFELTY  FMW:[id number] DOB: [birth year] DOA: 12/23/2023 PCP: Cyrus Selinda Moose, PA-C    Brief Narrative:  This 63 yrs old female with PMH significant for asthma, type 2 diabetes, hypertension, dyslipidemia and migraine presented in the ED with acute onset of shortness of breath associated with productive cough of yellowish sputum as well as wheezing for last few days. She also reports having some chest pain with coughing.She denies any fever but reports having significant chills.  In the ED She was hypoxic requiring 2 L of supplemental oxygen.  BNP 351.9, troponin 7.0, respiratory viral panel negative.  Chest x-ray shows no acute cardiopulmonary disease.  Patient was significantly wheezing.  Patient was given IV Solu-Medrol ,  IV magnesium , nebulized bronchodilators and patient was admitted for asthma exacerbation.  Assessment & Plan:   Principal Problem:   Asthma exacerbation Active Problems:   Dyslipidemia   Acute respiratory failure with hypoxia (HCC)   Essential hypertension   Type 2 diabetes mellitus with peripheral neuropathy (HCC)   Acute asthma exacerbation  Acute hypoxic respiratory failure Asthma exacerbation Patient presented with productive cough associated with significant shortness of breath and wheezing. She has been using her regular inhalers without any significant improvement. She does have diffuse expiratory wheezes with diminished expiratory airflow and harsh vesicular breathing. Continue IV Solu-Medrol  80 mg daily. Continue with scheduled and as needed nebulized bronchodilators with DuoNeb. Continue supplemental oxygen and wean as tolerated. Mucolytic therapy will be provided. O2 protocol will be followed.    Dyslipidemia: Continue statin therapy.  Essential hypertension: Continue hydrochlorothiazide .   Type 2 diabetes mellitus with peripheral neuropathy (HCC) Continue regular insulin  sliding scale. Continue Amaryl . Continue  Neurontin .  Morbid obesity: Diet and exercise discussed in detail.  DVT prophylaxis: Lovenox  Code Status: Full code Family Communication: No family at bedside Disposition Plan: Status is: Observation The patient remains OBS appropriate and will d/c before 2 midnights.   Patient admitted for acute hypoxic respiratory failure secondary to acute COPD exacerbation.   Consultants:  None  Procedures: CXR  Antimicrobials:  Anti-infectives (From admission, onward)    None      Subjective: Patient was seen and examined at bedside.  Overnight events noted. Patient was significantly wheezing but able to talk full sentences.  She remains on 2 L of supplemental oxygen.  Objective: Vitals:   12/24/23 0500 12/24/23 0600 12/24/23 0900 12/24/23 0955  BP: (!) 141/73 135/61 (!) 153/77   Pulse: 93 77 92   Resp: (!) 23 18 (!) 24   Temp:    98 F (36.7 C)  TempSrc:    Oral  SpO2: 97% 95% 94%   Weight:      Height:       No intake or output data in the 24 hours ending 12/24/23 1024 Filed Weights   12/23/23 1841  Weight: (!) 165.1 kg    Examination:  General exam: Appears calm and comfortable , morbidly obese, not in any acute distress. Respiratory system: Wheezing bilaterally. Respiratory effort normal. RR 16 Cardiovascular system: S1 & S2 heard, RRR. No JVD, murmurs, rubs, gallops or clicks.  Gastrointestinal system: Abdomen is nondistended, soft and nontender.  Normal bowel sounds heard. Central nervous system: Alert and oriented X 3. No focal neurological deficits. Extremities: No edema, no cyanosis, no clubbing. Skin: No rashes, lesions or ulcers Psychiatry: Judgement and insight appear normal. Mood & affect appropriate.     Data Reviewed: I have personally reviewed following labs and imaging studies  CBC: Recent Labs  Lab 12/23/23 1918 12/24/23 0424  WBC 8.7 9.9  NEUTROABS 5.6  --   HGB 13.6 12.3  HCT 43.4 38.5  MCV 83.8 81.7  PLT 282 283   Basic Metabolic  Panel: Recent Labs  Lab 12/23/23 1918 12/24/23 0424  NA 141 139  K 3.6 4.1  CL 103 104  CO2 26 25  GLUCOSE 132* 185*  BUN 14 12  CREATININE 0.69 0.54  CALCIUM  9.0 8.9   GFR: Estimated Creatinine Clearance: 118.6 mL/min (by C-G formula based on SCr of 0.54 mg/dL). Liver Function Tests: No results for input(s): AST, ALT, ALKPHOS, BILITOT, PROT, ALBUMIN in the last 168 hours. No results for input(s): LIPASE, AMYLASE in the last 168 hours. No results for input(s): AMMONIA in the last 168 hours. Coagulation Profile: No results for input(s): INR, PROTIME in the last 168 hours. Cardiac Enzymes: No results for input(s): CKTOTAL, CKMB, CKMBINDEX, TROPONINI in the last 168 hours. BNP (last 3 results) No results for input(s): PROBNP in the last 8760 hours. HbA1C: No results for input(s): HGBA1C in the last 72 hours. CBG: No results for input(s): GLUCAP in the last 168 hours. Lipid Profile: No results for input(s): CHOL, HDL, LDLCALC, TRIG, CHOLHDL, LDLDIRECT in the last 72 hours. Thyroid Function Tests: No results for input(s): TSH, T4TOTAL, FREET4, T3FREE, THYROIDAB in the last 72 hours. Anemia Panel: No results for input(s): VITAMINB12, FOLATE, FERRITIN, TIBC, IRON, RETICCTPCT in the last 72 hours. Sepsis Labs: No results for input(s): PROCALCITON, LATICACIDVEN in the last 168 hours.  Recent Results (from the past 240 hours)  Resp panel by RT-PCR (RSV, Flu A&B, Covid) Anterior Nasal Swab     Status: None   Collection Time: 12/23/23  6:46 PM   Specimen: Anterior Nasal Swab  Result Value Ref Range Status   SARS Coronavirus 2 by RT PCR NEGATIVE NEGATIVE Final    Comment: (NOTE) SARS-CoV-2 target nucleic acids are NOT DETECTED.  The SARS-CoV-2 RNA is generally detectable in upper respiratory specimens during the acute phase of infection. The lowest concentration of SARS-CoV-2 viral copies this assay can  detect is 138 copies/mL. A negative result does not preclude SARS-Cov-2 infection and should not be used as the sole basis for treatment or other patient management decisions. A negative result may occur with  improper specimen collection/handling, submission of specimen other than nasopharyngeal swab, presence of viral mutation(s) within the areas targeted by this assay, and inadequate number of viral copies(<138 copies/mL). A negative result must be combined with clinical observations, patient history, and epidemiological information. The expected result is Negative.  Fact Sheet for Patients:  BloggerCourse.com  Fact Sheet for Healthcare Providers:  SeriousBroker.it  This test is no t yet approved or cleared by the [place]  FDA and  has been authorized for detection and/or diagnosis of SARS-CoV-2 by FDA under an Emergency Use Authorization (EUA). This EUA will remain  in effect (meaning this test can be used) for the duration of the COVID-19 declaration under Section 564(b)(1) of the Act, 21 U.S.C.section 360bbb-3(b)(1), unless the authorization is terminated  or revoked sooner.       Influenza A by PCR NEGATIVE NEGATIVE Final   Influenza B by PCR NEGATIVE NEGATIVE Final    Comment: (NOTE) The Xpert Xpress SARS-CoV-2/FLU/RSV plus assay is intended as an aid in the diagnosis of influenza from Nasopharyngeal swab specimens and should not be used as a sole basis for treatment. Nasal washings and aspirates are unacceptable for Xpert Xpress SARS-CoV-2/FLU/RSV testing.  Fact Sheet for Patients: BloggerCourse.com  Fact Sheet for Healthcare Providers: SeriousBroker.it  This test is not yet approved or cleared by the [place]  FDA and has been authorized for detection and/or diagnosis of SARS-CoV-2 by FDA under an Emergency Use Authorization (EUA). This EUA will remain in  effect (meaning this test can be used) for the duration of the COVID-19 declaration under Section 564(b)(1) of the Act, 21 U.S.C. section 360bbb-3(b)(1), unless the authorization is terminated or revoked.     Resp Syncytial Virus by PCR NEGATIVE NEGATIVE Final    Comment: (NOTE) Fact Sheet for Patients: BloggerCourse.com  Fact Sheet for Healthcare Providers: SeriousBroker.it  This test is not yet approved or cleared by the [place]  FDA and has been authorized for detection and/or diagnosis of SARS-CoV-2 by FDA under an Emergency Use Authorization (EUA). This EUA will remain in effect (meaning this test can be used) for the duration of the COVID-19 declaration under Section 564(b)(1) of the Act, 21 U.S.C. section 360bbb-3(b)(1), unless the authorization is terminated or revoked.  Performed at [facility], [address], [place], [place] [address]     Radiology Studies: DG Chest Portable 1 View Result Date: 12/23/2023 CLINICAL DATA:  Shortness of breath. EXAM: PORTABLE CHEST 1 VIEW COMPARISON:  Chest radiograph dated 01/12/2023. FINDINGS: No focal consolidation, pleural effusion or pneumothorax. The cardiac silhouette is within normal limits. No acute osseous pathology. IMPRESSION: No active disease. Electronically Signed   By: Vanetta Chou M.D.   On: 12/23/2023 19:13   Scheduled Meds:  baclofen   10 mg Oral QHS   enoxaparin  (LOVENOX ) injection  85 mg Subcutaneous Q24H   gabapentin   300 mg Oral TID   glimepiride   1 mg Oral Q breakfast   guaiFENesin   600 mg Oral BID   hydrochlorothiazide   25 mg Oral Daily   ipratropium-albuterol   3 mL Nebulization QID   methylPREDNISolone  (SOLU-MEDROL ) injection  80 mg Intravenous Q24H   rosuvastatin   40 mg Oral Daily   Semaglutide   3 mg Oral Daily   Continuous Infusions:  sodium chloride  75 mL/hr at 12/23/23 2343     LOS: 0 days    Time spent: 50 mins    Darcel Dawley, MD [facility]   If 7PM-7AM, please contact night-coverage

## 2023-12-24 NOTE — Assessment & Plan Note (Signed)
 Continue statin therapy

## 2023-12-24 NOTE — Assessment & Plan Note (Signed)
 This is secondary to #1. O2 protocol will be provided.

## 2023-12-25 ENCOUNTER — Encounter: Payer: Self-pay | Admitting: Family Medicine

## 2023-12-25 DIAGNOSIS — J45901 Unspecified asthma with (acute) exacerbation: Secondary | ICD-10-CM | POA: Diagnosis not present

## 2023-12-25 LAB — GLUCOSE, CAPILLARY
Glucose-Capillary: 123 mg/dL — ABNORMAL HIGH (ref 70–99)
Glucose-Capillary: 198 mg/dL — ABNORMAL HIGH (ref 70–99)
Glucose-Capillary: 128 mg/dL — ABNORMAL HIGH (ref 70–99)
Glucose-Capillary: 161 mg/dL — ABNORMAL HIGH (ref 70–99)

## 2023-12-25 LAB — BLOOD GAS, VENOUS
Acid-Base Excess: 4.8 mmol/L — ABNORMAL HIGH (ref 0.0–2.0)
Bicarbonate: 32.4 mmol/L — ABNORMAL HIGH (ref 20.0–28.0)
O2 Saturation: 39.1 %
Patient temperature: 37
pCO2, Ven: 60 mmHg (ref 44–60)
pH, Ven: 7.34 (ref 7.25–7.43)

## 2023-12-25 MED ORDER — METHYLPREDNISOLONE SODIUM SUCC 125 MG IJ SOLR
60.0000 mg | Freq: Two times a day (BID) | INTRAMUSCULAR | Status: DC
  Administered 2023-12-25 – 2023-12-26 (×2): 60 mg via INTRAVENOUS
  Filled 2023-12-25 (×2): qty 2

## 2023-12-25 MED ORDER — GUAIFENESIN-DM 100-10 MG/5ML PO SYRP
5.0000 mL | ORAL_SOLUTION | ORAL | Status: DC | PRN
  Administered 2023-12-25 – 2023-12-31 (×15): 5 mL via ORAL
  Filled 2023-12-25 (×15): qty 10

## 2023-12-25 MED ORDER — IPRATROPIUM-ALBUTEROL 0.5-2.5 (3) MG/3ML IN SOLN
3.0000 mL | Freq: Three times a day (TID) | RESPIRATORY_TRACT | Status: DC
Start: 2023-12-25 — End: 2023-12-26
  Administered 2023-12-25 – 2023-12-26 (×3): 3 mL via RESPIRATORY_TRACT
  Filled 2023-12-25 (×3): qty 3

## 2023-12-25 NOTE — Progress Notes (Signed)
 PROGRESS NOTE    Alexandra Macias  FMW:[id number] DOB: [birth year] DOA: 12/23/2023 PCP: Cyrus Selinda Moose, PA-C    Brief Narrative:  This 63 yrs old female with PMH significant for asthma, type 2 diabetes, hypertension, dyslipidemia and migraine presented in the ED with acute onset of shortness of breath associated with productive cough of yellowish sputum as well as wheezing for last few days. She also reports having some chest pain with coughing.She denies any fever but reports having significant chills.  In the ED She was hypoxic requiring 2 L of supplemental oxygen.  BNP 351.9, troponin 7.0, respiratory viral panel negative.  Chest x-ray shows no acute cardiopulmonary disease.  Patient was significantly wheezing.  Patient was given IV Solu-Medrol ,  IV magnesium , nebulized bronchodilators and patient was admitted for asthma exacerbation.  Assessment & Plan:   Principal Problem:   Asthma exacerbation Active Problems:   Dyslipidemia   Acute respiratory failure with hypoxia (HCC)   Essential hypertension   Type 2 diabetes mellitus with peripheral neuropathy (HCC)   Acute asthma exacerbation  Acute hypoxic respiratory failure: Asthma exacerbation: Patient presented with productive cough associated with significant shortness of breath and wheezing. She has been using her regular inhalers without any significant improvement. She does have diffuse expiratory wheezes with diminished expiratory airflow and harsh vesicular breathing. Continued on IV Solu-Medrol  80 mg daily > Changed to solumedrol 60 mg iv q12hr for persistent wheezing. Continue with scheduled and as needed nebulized bronchodilators with DuoNeb. Continue supplemental oxygen and wean as tolerated. Continue Mucolytic therapy  Wean oxygen as tolerated. Check SPO2 while ambulation.    Dyslipidemia: Continue Crestor  40 mg daily.  Essential hypertension: Continue hydrochlorothiazide  25 mg daily.   Type 2 diabetes mellitus  with peripheral neuropathy (HCC) Continue regular insulin  sliding scale. Continue Amaryl . Continue Neurontin .  Morbid obesity: Diet and exercise discussed in detail.  DVT prophylaxis: Lovenox  Code Status: Full code Family Communication: No family at bedside Disposition Plan: Status is: Observation The patient remains OBS appropriate and will d/c before 2 midnights.   Patient admitted for acute hypoxic respiratory failure secondary to acute COPD exacerbation.  Consultants:  None  Procedures: CXR  Antimicrobials:  Anti-infectives (From admission, onward)    None      Subjective: Patient was seen and examined at bedside.  Overnight events noted. Patient was significantly wheezing but was able to talk full sentences.   She remains on 2 L of supplemental oxygen.  She reports not comfortable going home today.  Objective: Vitals:   12/25/23 0035 12/25/23 0439 12/25/23 0725 12/25/23 0811  BP: 119/64 135/77  (!) 129/56  Pulse: 69 69  71  Resp: 18 18  16   Temp: 98.3 F (36.8 C) 98.3 F (36.8 C)  98.3 F (36.8 C)  TempSrc:    Oral  SpO2: 95% 99% 98% 96%  Weight:      Height:        Intake/Output Summary (Last 24 hours) at 12/25/2023 0848 Last data filed at 12/24/2023 1947 Gross per 24 hour  Intake 1032.07 ml  Output --  Net 1032.07 ml   Filed Weights   12/23/23 1841  Weight: (!) 165.1 kg    Examination:  General exam: Appears calm and comfortable , morbidly obese, not in any acute distress. Respiratory system: Wheezing bilaterally. Respiratory effort normal. RR 15 Cardiovascular system: S1 & S2 heard, RRR. No JVD, murmurs, rubs, gallops or clicks.  Gastrointestinal system: Abdomen is non distended, soft and nontender.  Normal bowel sounds  heard. Central nervous system: Alert and oriented X 3. No focal neurological deficits. Extremities: No edema, no cyanosis, no clubbing. Skin: No rashes, lesions or ulcers Psychiatry: Judgement and insight appear normal. Mood  & affect appropriate.   Data Reviewed: I have personally reviewed following labs and imaging studies  CBC: Recent Labs  Lab 12/23/23 1918 12/24/23 0424  WBC 8.7 9.9  NEUTROABS 5.6  --   HGB 13.6 12.3  HCT 43.4 38.5  MCV 83.8 81.7  PLT 282 283   Basic Metabolic Panel: Recent Labs  Lab 12/23/23 1918 12/24/23 0424  NA 141 139  K 3.6 4.1  CL 103 104  CO2 26 25  GLUCOSE 132* 185*  BUN 14 12  CREATININE 0.69 0.54  CALCIUM  9.0 8.9   GFR: Estimated Creatinine Clearance: 118.6 mL/min (by C-G formula based on SCr of 0.54 mg/dL). Liver Function Tests: No results for input(s): AST, ALT, ALKPHOS, BILITOT, PROT, ALBUMIN in the last 168 hours. No results for input(s): LIPASE, AMYLASE in the last 168 hours. No results for input(s): AMMONIA in the last 168 hours. Coagulation Profile: No results for input(s): INR, PROTIME in the last 168 hours. Cardiac Enzymes: No results for input(s): CKTOTAL, CKMB, CKMBINDEX, TROPONINI in the last 168 hours. BNP (last 3 results) No results for input(s): PROBNP in the last 8760 hours. HbA1C: Recent Labs    12/24/23 0424  HGBA1C 6.4*   CBG: Recent Labs  Lab 12/24/23 1139 12/24/23 1648 12/24/23 2103 12/25/23 0814  GLUCAP 180* 135* 209* 123*   Lipid Profile: No results for input(s): CHOL, HDL, LDLCALC, TRIG, CHOLHDL, LDLDIRECT in the last 72 hours. Thyroid Function Tests: No results for input(s): TSH, T4TOTAL, FREET4, T3FREE, THYROIDAB in the last 72 hours. Anemia Panel: No results for input(s): VITAMINB12, FOLATE, FERRITIN, TIBC, IRON, RETICCTPCT in the last 72 hours. Sepsis Labs: No results for input(s): PROCALCITON, LATICACIDVEN in the last 168 hours.  Recent Results (from the past 240 hours)  Resp panel by RT-PCR (RSV, Flu A&B, Covid) Anterior Nasal Swab     Status: None   Collection Time: 12/23/23  6:46 PM   Specimen: Anterior Nasal Swab  Result Value Ref  Range Status   SARS Coronavirus 2 by RT PCR NEGATIVE NEGATIVE Final    Comment: (NOTE) SARS-CoV-2 target nucleic acids are NOT DETECTED.  The SARS-CoV-2 RNA is generally detectable in upper respiratory specimens during the acute phase of infection. The lowest concentration of SARS-CoV-2 viral copies this assay can detect is 138 copies/mL. A negative result does not preclude SARS-Cov-2 infection and should not be used as the sole basis for treatment or other patient management decisions. A negative result may occur with  improper specimen collection/handling, submission of specimen other than nasopharyngeal swab, presence of viral mutation(s) within the areas targeted by this assay, and inadequate number of viral copies(<138 copies/mL). A negative result must be combined with clinical observations, patient history, and epidemiological information. The expected result is Negative.  Fact Sheet for Patients:  BloggerCourse.com  Fact Sheet for Healthcare Providers:  SeriousBroker.it  This test is no t yet approved or cleared by the [place]  FDA and  has been authorized for detection and/or diagnosis of SARS-CoV-2 by FDA under an Emergency Use Authorization (EUA). This EUA will remain  in effect (meaning this test can be used) for the duration of the COVID-19 declaration under Section 564(b)(1) of the Act, 21 U.S.C.section 360bbb-3(b)(1), unless the authorization is terminated  or revoked sooner.       Influenza  A by PCR NEGATIVE NEGATIVE Final   Influenza B by PCR NEGATIVE NEGATIVE Final    Comment: (NOTE) The Xpert Xpress SARS-CoV-2/FLU/RSV plus assay is intended as an aid in the diagnosis of influenza from Nasopharyngeal swab specimens and should not be used as a sole basis for treatment. Nasal washings and aspirates are unacceptable for Xpert Xpress SARS-CoV-2/FLU/RSV testing.  Fact Sheet for  Patients: BloggerCourse.com  Fact Sheet for Healthcare Providers: SeriousBroker.it  This test is not yet approved or cleared by the [place]  FDA and has been authorized for detection and/or diagnosis of SARS-CoV-2 by FDA under an Emergency Use Authorization (EUA). This EUA will remain in effect (meaning this test can be used) for the duration of the COVID-19 declaration under Section 564(b)(1) of the Act, 21 U.S.C. section 360bbb-3(b)(1), unless the authorization is terminated or revoked.     Resp Syncytial Virus by PCR NEGATIVE NEGATIVE Final    Comment: (NOTE) Fact Sheet for Patients: BloggerCourse.com  Fact Sheet for Healthcare Providers: SeriousBroker.it  This test is not yet approved or cleared by the [place]  FDA and has been authorized for detection and/or diagnosis of SARS-CoV-2 by FDA under an Emergency Use Authorization (EUA). This EUA will remain in effect (meaning this test can be used) for the duration of the COVID-19 declaration under Section 564(b)(1) of the Act, 21 U.S.C. section 360bbb-3(b)(1), unless the authorization is terminated or revoked.  Performed at [facility], [address] [place], [place] [address]     Radiology Studies: DG Chest Portable 1 View Result Date: 12/23/2023 CLINICAL DATA:  Shortness of breath. EXAM: PORTABLE CHEST 1 VIEW COMPARISON:  Chest radiograph dated 01/12/2023. FINDINGS: No focal consolidation, pleural effusion or pneumothorax. The cardiac silhouette is within normal limits. No acute osseous pathology. IMPRESSION: No active disease. Electronically Signed   By: Vanetta Chou M.D.   On: 12/23/2023 19:13   Scheduled Meds:  baclofen   10 mg Oral QHS   enoxaparin  (LOVENOX ) injection  85 mg Subcutaneous Q24H   gabapentin   300 mg Oral TID   glimepiride   1 mg Oral Q breakfast   guaiFENesin   600 mg Oral BID    hydrochlorothiazide   25 mg Oral Daily   insulin  aspart  0-20 Units Subcutaneous TID WC   insulin  aspart  0-5 Units Subcutaneous QHS   ipratropium-albuterol   3 mL Nebulization QID   methylPREDNISolone  (SOLU-MEDROL ) injection  60 mg Intravenous Q12H   rosuvastatin   40 mg Oral Daily   Continuous Infusions:     LOS: 1 day    Time spent: 35 mins    Darcel Dawley, MD [facility]   If 7PM-7AM, please contact night-coverage

## 2023-12-25 NOTE — Plan of Care (Signed)
  Problem: Coping: Goal: Ability to adjust to condition or change in health will improve Outcome: Progressing   Problem: Metabolic: Goal: Ability to maintain appropriate glucose levels will improve Outcome: Progressing   Problem: Tissue Perfusion: Goal: Adequacy of tissue perfusion will improve Outcome: Progressing   Problem: Clinical Measurements: Goal: Ability to maintain clinical measurements within normal limits will improve Outcome: Progressing   Problem: Pain Managment: Goal: General experience of comfort will improve and/or be controlled Outcome: Progressing

## 2023-12-26 DIAGNOSIS — J45901 Unspecified asthma with (acute) exacerbation: Secondary | ICD-10-CM | POA: Diagnosis not present

## 2023-12-26 LAB — MAGNESIUM: Magnesium: 2 mg/dL (ref 1.7–2.4)

## 2023-12-26 LAB — CBC
HCT: 38.1 % (ref 36.0–46.0)
Hemoglobin: 12.1 g/dL (ref 12.0–15.0)
MCH: 26.1 pg (ref 26.0–34.0)
MCHC: 31.8 g/dL (ref 30.0–36.0)
MCV: 82.3 fL (ref 80.0–100.0)
Platelets: 291 K/uL (ref 150–400)
RBC: 4.63 MIL/uL (ref 3.87–5.11)
RDW: 15.1 % (ref 11.5–15.5)
WBC: 12.5 K/uL — ABNORMAL HIGH (ref 4.0–10.5)
nRBC: 0 % (ref 0.0–0.2)

## 2023-12-26 LAB — BASIC METABOLIC PANEL WITH GFR
Anion gap: 9 (ref 5–15)
BUN: 18 mg/dL (ref 8–23)
CO2: 29 mmol/L (ref 22–32)
Calcium: 9.1 mg/dL (ref 8.9–10.3)
Chloride: 101 mmol/L (ref 98–111)
Creatinine, Ser: 0.68 mg/dL (ref 0.44–1.00)
GFR, Estimated: >60 mL/min (ref >60)
Glucose, Bld: 107 mg/dL — ABNORMAL HIGH (ref 70–99)
Potassium: 4.5 mmol/L (ref 3.5–5.1)
Sodium: 139 mmol/L (ref 135–145)

## 2023-12-26 LAB — PHOSPHORUS: Phosphorus: 2.5 mg/dL (ref 2.5–4.6)

## 2023-12-26 LAB — GLUCOSE, CAPILLARY
Glucose-Capillary: 143 mg/dL — ABNORMAL HIGH (ref 70–99)
Glucose-Capillary: 187 mg/dL — ABNORMAL HIGH (ref 70–99)
Glucose-Capillary: 135 mg/dL — ABNORMAL HIGH (ref 70–99)
Glucose-Capillary: 205 mg/dL — ABNORMAL HIGH (ref 70–99)

## 2023-12-26 MED ORDER — ARFORMOTEROL TARTRATE 15 MCG/2ML IN NEBU
15.0000 ug | INHALATION_SOLUTION | Freq: Two times a day (BID) | RESPIRATORY_TRACT | Status: DC
  Administered 2023-12-26 – 2023-12-31 (×11): 15 ug via RESPIRATORY_TRACT
  Filled 2023-12-26 (×12): qty 2

## 2023-12-26 MED ORDER — BENZONATATE 100 MG PO CAPS
200.0000 mg | ORAL_CAPSULE | Freq: Three times a day (TID) | ORAL | Status: DC
  Administered 2023-12-26 – 2023-12-31 (×16): 200 mg via ORAL
  Filled 2023-12-26 (×16): qty 2

## 2023-12-26 MED ORDER — BUDESONIDE 0.25 MG/2ML IN SUSP
0.2500 mg | Freq: Two times a day (BID) | RESPIRATORY_TRACT | Status: DC
  Administered 2023-12-26 – 2023-12-31 (×11): 0.25 mg via RESPIRATORY_TRACT
  Filled 2023-12-26 (×11): qty 2

## 2023-12-26 MED ORDER — IPRATROPIUM-ALBUTEROL 0.5-2.5 (3) MG/3ML IN SOLN
3.0000 mL | RESPIRATORY_TRACT | Status: DC
  Administered 2023-12-26 – 2023-12-29 (×19): 3 mL via RESPIRATORY_TRACT
  Filled 2023-12-26 (×19): qty 3

## 2023-12-26 MED ORDER — HYDROCOD POLI-CHLORPHE POLI ER 10-8 MG/5ML PO SUER
5.0000 mL | Freq: Every day | ORAL | Status: DC
  Administered 2023-12-26 – 2023-12-30 (×5): 5 mL via ORAL
  Filled 2023-12-26 (×5): qty 5

## 2023-12-26 MED ORDER — METHYLPREDNISOLONE SODIUM SUCC 40 MG IJ SOLR
40.0000 mg | Freq: Two times a day (BID) | INTRAMUSCULAR | Status: DC
  Administered 2023-12-26 – 2023-12-28 (×3): 40 mg via INTRAVENOUS
  Filled 2023-12-26 (×4): qty 1

## 2023-12-26 MED ORDER — OXYCODONE HCL 5 MG PO TABS
5.0000 mg | ORAL_TABLET | ORAL | Status: DC | PRN
  Administered 2023-12-26 – 2023-12-28 (×6): 5 mg via ORAL
  Administered 2023-12-28: 10 mg via ORAL
  Administered 2023-12-28: 5 mg via ORAL
  Administered 2023-12-29: 10 mg via ORAL
  Administered 2023-12-29: 5 mg via ORAL
  Administered 2023-12-29: 10 mg via ORAL
  Administered 2023-12-29: 5 mg via ORAL
  Administered 2023-12-30 – 2023-12-31 (×3): 10 mg via ORAL
  Filled 2023-12-26: qty 1
  Filled 2023-12-26: qty 2
  Filled 2023-12-26 (×2): qty 1
  Filled 2023-12-26: qty 2
  Filled 2023-12-26 (×2): qty 1
  Filled 2023-12-26: qty 2
  Filled 2023-12-26 (×2): qty 1
  Filled 2023-12-26 (×3): qty 2
  Filled 2023-12-26 (×2): qty 1

## 2023-12-26 NOTE — Progress Notes (Signed)
 PROGRESS NOTE    Alexandra Macias  FMW:[id number] DOB: [birth year] DOA: 12/23/2023 PCP: Cyrus Selinda Moose, PA-C    Brief Narrative:   This 63 yrs old female with PMH significant for asthma, type 2 diabetes, hypertension, dyslipidemia and migraine presented in the ED with acute onset of shortness of breath associated with productive cough of yellowish sputum as well as wheezing for last few days. She also reports having some chest pain with coughing.She denies any fever but reports having significant chills.  In the ED She was hypoxic requiring 2 L of supplemental oxygen.  BNP 351.9, troponin 7.0, respiratory viral panel negative.  Chest x-ray shows no acute cardiopulmonary disease.  Patient was significantly wheezing.  Patient was given IV Solu-Medrol ,  IV magnesium , nebulized bronchodilators and patient was admitted for asthma exacerbation.     Assessment & Plan:   Principal Problem:   Asthma exacerbation Active Problems:   Dyslipidemia   Acute respiratory failure with hypoxia (HCC)   Essential hypertension   Type 2 diabetes mellitus with peripheral neuropathy (HCC)   Acute asthma exacerbation  Acute hypoxic respiratory failure: Asthma exacerbation: Patient presented with productive cough associated with significant shortness of breath and wheezing.  Home inhalers ineffective Plan: Solu-Medrol  40 mg IV twice daily DuoNebs every 4 hours Twice daily Brovana  Twice daily Pulmicort  Wean oxygen as tolerated Antitussives and mucolytic's  Dyslipidemia: PTA Crestor    Essential hypertension: PTA hydrochlorothiazide    Type 2 diabetes mellitus with peripheral neuropathy (HCC) Okay to resume Amaryl  and Neurontin    Morbid obesity: Complicating factor in overall care and prognosis    DVT prophylaxis: Lovenox  Code Status: Full Family Communication: None Disposition Plan: Status is: Inpatient Remains inpatient appropriate because: Severe asthma exacerbation   Level of care:  Telemetry Medical  Consultants:  None  Procedures:  None  Antimicrobials: None   Subjective: Seen and examined.  Lying in bed.  Significant cough, wheeze, shortness of breath  Objective: Vitals:   12/25/23 1947 12/25/23 1949 12/26/23 0353 12/26/23 0749  BP: 126/68  (!) 151/82 (!) 153/73  Pulse: 66  (!) 59 (!) 57  Resp: 18  18 20   Temp: (!) 97.5 F (36.4 C)  98.4 F (36.9 C) 98.6 F (37 C)  TempSrc:      SpO2: 94% 94% 99% 98%  Weight:      Height:        Intake/Output Summary (Last 24 hours) at 12/26/2023 1221 Last data filed at 12/25/2023 1900 Gross per 24 hour  Intake 480 ml  Output --  Net 480 ml   Filed Weights   12/23/23 1841  Weight: (!) 165.1 kg    Examination:  General exam: Appears uncomfortable Respiratory system: Diffuse coarse wheeze in all lung fields.  2 L Cardiovascular system: S1-S2, RRR, no murmurs, no pedal edema Gastrointestinal system: Obese, soft, NT/ND, normal bowel sounds Central nervous system: Alert and oriented. No focal neurological deficits. Extremities: Symmetric 5 x 5 power. Skin: No rashes, lesions or ulcers Psychiatry: Judgement and insight appear normal. Mood & affect appropriate.     Data Reviewed: I have personally reviewed following labs and imaging studies  CBC: Recent Labs  Lab 12/23/23 1918 12/24/23 0424 12/26/23 0309  WBC 8.7 9.9 12.5*  NEUTROABS 5.6  --   --   HGB 13.6 12.3 12.1  HCT 43.4 38.5 38.1  MCV 83.8 81.7 82.3  PLT 282 283 291   Basic Metabolic Panel: Recent Labs  Lab 12/23/23 1918 12/24/23 0424 12/26/23 0309  NA 141  139 139  K 3.6 4.1 4.5  CL 103 104 101  CO2 26 25 29   GLUCOSE 132* 185* 107*  BUN 14 12 18   CREATININE 0.69 0.54 0.68  CALCIUM  9.0 8.9 9.1  MG  --   --  2.0  PHOS  --   --  2.5   GFR: Estimated Creatinine Clearance: 118.6 mL/min (by C-G formula based on SCr of 0.68 mg/dL). Liver Function Tests: No results for input(s): AST, ALT, ALKPHOS, BILITOT, PROT,  ALBUMIN in the last 168 hours. No results for input(s): LIPASE, AMYLASE in the last 168 hours. No results for input(s): AMMONIA in the last 168 hours. Coagulation Profile: No results for input(s): INR, PROTIME in the last 168 hours. Cardiac Enzymes: No results for input(s): CKTOTAL, CKMB, CKMBINDEX, TROPONINI in the last 168 hours. BNP (last 3 results) No results for input(s): PROBNP in the last 8760 hours. HbA1C: Recent Labs    12/24/23 0424  HGBA1C 6.4*   CBG: Recent Labs  Lab 12/25/23 1226 12/25/23 1642 12/25/23 1947 12/26/23 0749 12/26/23 1145  GLUCAP 198* 128* 161* 143* 187*   Lipid Profile: No results for input(s): CHOL, HDL, LDLCALC, TRIG, CHOLHDL, LDLDIRECT in the last 72 hours. Thyroid Function Tests: No results for input(s): TSH, T4TOTAL, FREET4, T3FREE, THYROIDAB in the last 72 hours. Anemia Panel: No results for input(s): VITAMINB12, FOLATE, FERRITIN, TIBC, IRON, RETICCTPCT in the last 72 hours. Sepsis Labs: No results for input(s): PROCALCITON, LATICACIDVEN in the last 168 hours.  Recent Results (from the past 240 hours)  Resp panel by RT-PCR (RSV, Flu A&B, Covid) Anterior Nasal Swab     Status: None   Collection Time: 12/23/23  6:46 PM   Specimen: Anterior Nasal Swab  Result Value Ref Range Status   SARS Coronavirus 2 by RT PCR NEGATIVE NEGATIVE Final    Comment: (NOTE) SARS-CoV-2 target nucleic acids are NOT DETECTED.  The SARS-CoV-2 RNA is generally detectable in upper respiratory specimens during the acute phase of infection. The lowest concentration of SARS-CoV-2 viral copies this assay can detect is 138 copies/mL. A negative result does not preclude SARS-Cov-2 infection and should not be used as the sole basis for treatment or other patient management decisions. A negative result may occur with  improper specimen collection/handling, submission of specimen other than nasopharyngeal swab,  presence of viral mutation(s) within the areas targeted by this assay, and inadequate number of viral copies(<138 copies/mL). A negative result must be combined with clinical observations, patient history, and epidemiological information. The expected result is Negative.  Fact Sheet for Patients:  BloggerCourse.com  Fact Sheet for Healthcare Providers:  SeriousBroker.it  This test is no t yet approved or cleared by the [place]  FDA and  has been authorized for detection and/or diagnosis of SARS-CoV-2 by FDA under an Emergency Use Authorization (EUA). This EUA will remain  in effect (meaning this test can be used) for the duration of the COVID-19 declaration under Section 564(b)(1) of the Act, 21 U.S.C.section 360bbb-3(b)(1), unless the authorization is terminated  or revoked sooner.       Influenza A by PCR NEGATIVE NEGATIVE Final   Influenza B by PCR NEGATIVE NEGATIVE Final    Comment: (NOTE) The Xpert Xpress SARS-CoV-2/FLU/RSV plus assay is intended as an aid in the diagnosis of influenza from Nasopharyngeal swab specimens and should not be used as a sole basis for treatment. Nasal washings and aspirates are unacceptable for Xpert Xpress SARS-CoV-2/FLU/RSV testing.  Fact Sheet for Patients: BloggerCourse.com  Fact Sheet for  Healthcare Providers: SeriousBroker.it  This test is not yet approved or cleared by the [place]  FDA and has been authorized for detection and/or diagnosis of SARS-CoV-2 by FDA under an Emergency Use Authorization (EUA). This EUA will remain in effect (meaning this test can be used) for the duration of the COVID-19 declaration under Section 564(b)(1) of the Act, 21 U.S.C. section 360bbb-3(b)(1), unless the authorization is terminated or revoked.     Resp Syncytial Virus by PCR NEGATIVE NEGATIVE Final    Comment: (NOTE) Fact Sheet for  Patients: BloggerCourse.com  Fact Sheet for Healthcare Providers: SeriousBroker.it  This test is not yet approved or cleared by the [place]  FDA and has been authorized for detection and/or diagnosis of SARS-CoV-2 by FDA under an Emergency Use Authorization (EUA). This EUA will remain in effect (meaning this test can be used) for the duration of the COVID-19 declaration under Section 564(b)(1) of the Act, 21 U.S.C. section 360bbb-3(b)(1), unless the authorization is terminated or revoked.  Performed at [facility], [address] [place], [place] [address]          Radiology Studies: No results found.      Scheduled Meds:  arformoterol   15 mcg Nebulization BID   baclofen   10 mg Oral QHS   benzonatate   200 mg Oral TID   budesonide  (PULMICORT ) nebulizer solution  0.25 mg Nebulization BID   chlorpheniramine-HYDROcodone   5 mL Oral QHS   enoxaparin  (LOVENOX ) injection  85 mg Subcutaneous Q24H   gabapentin   300 mg Oral TID   glimepiride   1 mg Oral Q breakfast   guaiFENesin   600 mg Oral BID   hydrochlorothiazide   25 mg Oral Daily   insulin  aspart  0-20 Units Subcutaneous TID WC   insulin  aspart  0-5 Units Subcutaneous QHS   ipratropium-albuterol   3 mL Nebulization Q4H   methylPREDNISolone  (SOLU-MEDROL ) injection  40 mg Intravenous Q12H   rosuvastatin   40 mg Oral Daily   Continuous Infusions:   LOS: 2 days       Calvin KATHEE Robson, MD [facility]   If 7PM-7AM, please contact night-coverage  12/26/2023, 12:21 PM

## 2023-12-26 NOTE — Plan of Care (Signed)
  Problem: Coping: Goal: Ability to adjust to condition or change in health will improve Outcome: Progressing   Problem: Metabolic: Goal: Ability to maintain appropriate glucose levels will improve Outcome: Progressing   Problem: Clinical Measurements: Goal: Ability to maintain clinical measurements within normal limits will improve Outcome: Progressing   Problem: Pain Managment: Goal: General experience of comfort will improve and/or be controlled Outcome: Progressing   Problem: Elimination: Goal: Will not experience complications related to bowel motility Outcome: Progressing   Problem: Pain Managment: Goal: General experience of comfort will improve and/or be controlled Outcome: Progressing   Problem: Safety: Goal: Ability to remain free from injury will improve Outcome: Progressing

## 2023-12-27 DIAGNOSIS — J45901 Unspecified asthma with (acute) exacerbation: Secondary | ICD-10-CM | POA: Diagnosis not present

## 2023-12-27 LAB — GLUCOSE, CAPILLARY
Glucose-Capillary: 127 mg/dL — ABNORMAL HIGH (ref 70–99)
Glucose-Capillary: 159 mg/dL — ABNORMAL HIGH (ref 70–99)
Glucose-Capillary: 128 mg/dL — ABNORMAL HIGH (ref 70–99)
Glucose-Capillary: 151 mg/dL — ABNORMAL HIGH (ref 70–99)

## 2023-12-27 MED ORDER — MORPHINE SULFATE (PF) 2 MG/ML IV SOLN: 2.0000 mg | INTRAVENOUS | Status: DC | PRN

## 2023-12-27 NOTE — Plan of Care (Signed)

## 2023-12-27 NOTE — Progress Notes (Signed)
 PROGRESS NOTE    Alexandra Macias  FMW:[id number] DOB: June 26, 1960 DOA: 12/23/2023 PCP: Cyrus Selinda Moose, PA-C    Brief Narrative:   This 63 yrs old female with PMH significant for asthma, type 2 diabetes, hypertension, dyslipidemia and migraine presented in the ED with acute onset of shortness of breath associated with productive cough of yellowish sputum as well as wheezing for last few days. She also reports having some chest pain with coughing.She denies any fever but reports having significant chills.  In the ED She was hypoxic requiring 2 L of supplemental oxygen.  BNP 351.9, troponin 7.0, respiratory viral panel negative.  Chest x-ray shows no acute cardiopulmonary disease.  Patient was significantly wheezing.  Patient was given IV Solu-Medrol ,  IV magnesium , nebulized bronchodilators and patient was admitted for asthma exacerbation.   Assessment & Plan:   Principal Problem:   Asthma exacerbation Active Problems:   Dyslipidemia   Acute respiratory failure with hypoxia (HCC)   Essential hypertension   Type 2 diabetes mellitus with peripheral neuropathy (HCC)   Acute asthma exacerbation  Acute hypoxic respiratory failure: Asthma exacerbation: Patient presented with productive cough associated with significant shortness of breath and wheezing.  Home inhalers ineffective.  Still wheezing Plan: MetaNeb every 4 hours Solu-Medrol  40 mg IV twice daily DuoNebs every 4 hours Twice daily Brovana  Twice daily Pulmicort  Wean oxygen as tolerated Antitussives and mucolytic's  Dyslipidemia: PTA Crestor    Essential hypertension: PTA hydrochlorothiazide    Type 2 diabetes mellitus with peripheral neuropathy (HCC) Okay to resume Amaryl  and Neurontin    Morbid obesity: Complicating factor in overall care and prognosis    DVT prophylaxis: Lovenox  Code Status: Full Family Communication: None Disposition Plan: Status is: Inpatient Remains inpatient appropriate because: Severe asthma  exacerbation   Level of care: Telemetry Medical  Consultants:  None  Procedures:  None  Antimicrobials: None   Subjective: Seen and examined.  Lying in bed.  Significant cough, wheeze, shortness of breath  Objective: Vitals:   12/27/23 0706 12/27/23 0729 12/27/23 0930 12/27/23 0932  BP:  (!) 152/86 (!) 148/76   Pulse:  72 83   Resp:  (!) 21 (!) 22   Temp:  98 F (36.7 C) 98.3 F (36.8 C)   TempSrc:   Oral   SpO2: 93% 93% (!) 87% 93%  Weight:      Height:        Intake/Output Summary (Last 24 hours) at 12/27/2023 1331 Last data filed at 12/27/2023 0900 Gross per 24 hour  Intake 120 ml  Output --  Net 120 ml   Filed Weights   12/23/23 1841  Weight: (!) 165.1 kg    Examination:  General exam: Appears uncomfortable Respiratory system: Diffuse coarse wheeze in all lung fields.  2 L Cardiovascular system: S1-S2, RRR, no murmurs, no pedal edema Gastrointestinal system: Obese, soft, NT/ND, normal bowel sounds Central nervous system: Alert and oriented. No focal neurological deficits. Extremities: Symmetric 5 x 5 power. Skin: No rashes, lesions or ulcers Psychiatry: Judgement and insight appear normal. Mood & affect appropriate.     Data Reviewed: I have personally reviewed following labs and imaging studies  CBC: Recent Labs  Lab 12/23/23 1918 12/24/23 0424 12/26/23 0309  WBC 8.7 9.9 12.5*  NEUTROABS 5.6  --   --   HGB 13.6 12.3 12.1  HCT 43.4 38.5 38.1  MCV 83.8 81.7 82.3  PLT 282 283 291   Basic Metabolic Panel: Recent Labs  Lab 12/23/23 1918 12/24/23 0424 12/26/23 0309  NA 141 139 139  K 3.6 4.1 4.5  CL 103 104 101  CO2 26 25 29   GLUCOSE 132* 185* 107*  BUN 14 12 18   CREATININE 0.69 0.54 0.68  CALCIUM  9.0 8.9 9.1  MG  --   --  2.0  PHOS  --   --  2.5   GFR: Estimated Creatinine Clearance: 118.6 mL/min (by C-G formula based on SCr of 0.68 mg/dL). Liver Function Tests: No results for input(s): AST, ALT, ALKPHOS, BILITOT,  PROT, ALBUMIN in the last 168 hours. No results for input(s): LIPASE, AMYLASE in the last 168 hours. No results for input(s): AMMONIA in the last 168 hours. Coagulation Profile: No results for input(s): INR, PROTIME in the last 168 hours. Cardiac Enzymes: No results for input(s): CKTOTAL, CKMB, CKMBINDEX, TROPONINI in the last 168 hours. BNP (last 3 results) No results for input(s): PROBNP in the last 8760 hours. HbA1C: No results for input(s): HGBA1C in the last 72 hours.  CBG: Recent Labs  Lab 12/26/23 1145 12/26/23 1641 12/26/23 1944 12/27/23 0731 12/27/23 1147  GLUCAP 187* 135* 205* 127* 159*   Lipid Profile: No results for input(s): CHOL, HDL, LDLCALC, TRIG, CHOLHDL, LDLDIRECT in the last 72 hours. Thyroid Function Tests: No results for input(s): TSH, T4TOTAL, FREET4, T3FREE, THYROIDAB in the last 72 hours. Anemia Panel: No results for input(s): VITAMINB12, FOLATE, FERRITIN, TIBC, IRON, RETICCTPCT in the last 72 hours. Sepsis Labs: No results for input(s): PROCALCITON, LATICACIDVEN in the last 168 hours.  Recent Results (from the past 240 hours)  Resp panel by RT-PCR (RSV, Flu A&B, Covid) Anterior Nasal Swab     Status: None   Collection Time: 12/23/23  6:46 PM   Specimen: Anterior Nasal Swab  Result Value Ref Range Status   SARS Coronavirus 2 by RT PCR NEGATIVE NEGATIVE Final    Comment: (NOTE) SARS-CoV-2 target nucleic acids are NOT DETECTED.  The SARS-CoV-2 RNA is generally detectable in upper respiratory specimens during the acute phase of infection. The lowest concentration of SARS-CoV-2 viral copies this assay can detect is 138 copies/mL. A negative result does not preclude SARS-Cov-2 infection and should not be used as the sole basis for treatment or other patient management decisions. A negative result may occur with  improper specimen collection/handling, submission of specimen other than  nasopharyngeal swab, presence of viral mutation(s) within the areas targeted by this assay, and inadequate number of viral copies(<138 copies/mL). A negative result must be combined with clinical observations, patient history, and epidemiological information. The expected result is Negative.  Fact Sheet for Patients:  BloggerCourse.com  Fact Sheet for Healthcare Providers:  SeriousBroker.it  This test is no t yet approved or cleared by the [place]  FDA and  has been authorized for detection and/or diagnosis of SARS-CoV-2 by FDA under an Emergency Use Authorization (EUA). This EUA will remain  in effect (meaning this test can be used) for the duration of the COVID-19 declaration under Section 564(b)(1) of the Act, 21 U.S.C.section 360bbb-3(b)(1), unless the authorization is terminated  or revoked sooner.       Influenza A by PCR NEGATIVE NEGATIVE Final   Influenza B by PCR NEGATIVE NEGATIVE Final    Comment: (NOTE) The Xpert Xpress SARS-CoV-2/FLU/RSV plus assay is intended as an aid in the diagnosis of influenza from Nasopharyngeal swab specimens and should not be used as a sole basis for treatment. Nasal washings and aspirates are unacceptable for Xpert Xpress SARS-CoV-2/FLU/RSV testing.  Fact Sheet for Patients: BloggerCourse.com  Fact Sheet  for Healthcare Providers: SeriousBroker.it  This test is not yet approved or cleared by the [place]  FDA and has been authorized for detection and/or diagnosis of SARS-CoV-2 by FDA under an Emergency Use Authorization (EUA). This EUA will remain in effect (meaning this test can be used) for the duration of the COVID-19 declaration under Section 564(b)(1) of the Act, 21 U.S.C. section 360bbb-3(b)(1), unless the authorization is terminated or revoked.     Resp Syncytial Virus by PCR NEGATIVE NEGATIVE Final    Comment:  (NOTE) Fact Sheet for Patients: BloggerCourse.com  Fact Sheet for Healthcare Providers: SeriousBroker.it  This test is not yet approved or cleared by the [place]  FDA and has been authorized for detection and/or diagnosis of SARS-CoV-2 by FDA under an Emergency Use Authorization (EUA). This EUA will remain in effect (meaning this test can be used) for the duration of the COVID-19 declaration under Section 564(b)(1) of the Act, 21 U.S.C. section 360bbb-3(b)(1), unless the authorization is terminated or revoked.  Performed at [facility], [address], [place], [place] [address]          Radiology Studies: No results found.      Scheduled Meds:  arformoterol   15 mcg Nebulization BID   baclofen   10 mg Oral QHS   benzonatate   200 mg Oral TID   budesonide  (PULMICORT ) nebulizer solution  0.25 mg Nebulization BID   chlorpheniramine-HYDROcodone   5 mL Oral QHS   enoxaparin  (LOVENOX ) injection  85 mg Subcutaneous Q24H   gabapentin   300 mg Oral TID   glimepiride   1 mg Oral Q breakfast   guaiFENesin   600 mg Oral BID   hydrochlorothiazide   25 mg Oral Daily   insulin  aspart  0-20 Units Subcutaneous TID WC   insulin  aspart  0-5 Units Subcutaneous QHS   ipratropium-albuterol   3 mL Nebulization Q4H   methylPREDNISolone  (SOLU-MEDROL ) injection  40 mg Intravenous Q12H   rosuvastatin   40 mg Oral Daily   Continuous Infusions:   LOS: 3 days       Calvin KATHEE Robson, MD [facility]   If 7PM-7AM, please contact night-coverage  12/27/2023, 1:31 PM

## 2023-12-28 DIAGNOSIS — J45901 Unspecified asthma with (acute) exacerbation: Secondary | ICD-10-CM | POA: Diagnosis not present

## 2023-12-28 LAB — GLUCOSE, CAPILLARY
Glucose-Capillary: 126 mg/dL — ABNORMAL HIGH (ref 70–99)
Glucose-Capillary: 254 mg/dL — ABNORMAL HIGH (ref 70–99)
Glucose-Capillary: 83 mg/dL (ref 70–99)
Glucose-Capillary: 106 mg/dL — ABNORMAL HIGH (ref 70–99)

## 2023-12-28 MED ORDER — ALUM & MAG HYDROXIDE-SIMETH 200-200-20 MG/5ML PO SUSP
30.0000 mL | Freq: Four times a day (QID) | ORAL | Status: DC | PRN
  Administered 2023-12-28: 30 mL via ORAL
  Filled 2023-12-28: qty 30

## 2023-12-28 MED ORDER — METHYLPREDNISOLONE SODIUM SUCC 40 MG IJ SOLR
40.0000 mg | Freq: Every day | INTRAMUSCULAR | Status: DC
Start: 2023-12-29 — End: 2023-12-29
  Administered 2023-12-29: 40 mg via INTRAVENOUS
  Filled 2023-12-28: qty 1

## 2023-12-28 MED ORDER — GLUCERNA SHAKE PO LIQD
237.0000 mL | Freq: Three times a day (TID) | ORAL | Status: DC
  Administered 2023-12-28 – 2023-12-31 (×9): 237 mL via ORAL

## 2023-12-28 NOTE — Progress Notes (Signed)
 Mobility Specialist - Progress Note     12/28/23 1100  Mobility  Activity Ambulated independently  Level of Assistance Modified independent, requires aide device or extra time  Assistive Device Front wheel walker  Distance Ambulated (ft) 160 ft  Range of Motion/Exercises Active  Activity Response Tolerated well  Mobility Referral Yes  Mobility visit 1 Mobility  Mobility Specialist Start Time (ACUTE ONLY) 1024  Mobility Specialist Stop Time (ACUTE ONLY) 1036  Mobility Specialist Time Calculation (min) (ACUTE ONLY) 12 min   Pt resting in bed on RA upon entry. Pt displays SOB and wheezing but agreeable to participate in ambulation. Pt STS and ambulates to hallway around NS ModI with RW. Pt returned to recliner and left with needs in reach.   Guido Rumble Mobility Specialist 12/28/23, 11:29 AM

## 2023-12-28 NOTE — Plan of Care (Signed)

## 2023-12-28 NOTE — Care Management Important Message (Signed)
 Important Message  Patient Details  Name: CLARIBEL SACHS MRN: [id number] Date of Birth: 08/26/1960   Important Message Given:  Yes - Medicare IM     Leontine Radman W, CMA 12/28/2023, 9:50 AM

## 2023-12-28 NOTE — Progress Notes (Signed)
 PROGRESS NOTE    Alexandra Macias  FMW:[id number] DOB: 1961/03/14 DOA: 12/23/2023 PCP: Cyrus Selinda Moose, PA-C    Brief Narrative:   This 63 yrs old female with PMH significant for asthma, type 2 diabetes, hypertension, dyslipidemia and migraine presented in the ED with acute onset of shortness of breath associated with productive cough of yellowish sputum as well as wheezing for last few days. She also reports having some chest pain with coughing.She denies any fever but reports having significant chills.  In the ED She was hypoxic requiring 2 L of supplemental oxygen.  BNP 351.9, troponin 7.0, respiratory viral panel negative.  Chest x-ray shows no acute cardiopulmonary disease.  Patient was significantly wheezing.  Patient was given IV Solu-Medrol ,  IV magnesium , nebulized bronchodilators and patient was admitted for asthma exacerbation.   Assessment & Plan:   Principal Problem:   Asthma exacerbation Active Problems:   Dyslipidemia   Acute respiratory failure with hypoxia (HCC)   Essential hypertension   Type 2 diabetes mellitus with peripheral neuropathy (HCC)   Acute asthma exacerbation  Acute hypoxic respiratory failure: Asthma exacerbation: Patient presented with productive cough associated with significant shortness of breath and wheezing.  Home inhalers ineffective.  Still wheezing Plan: MetaNeb every 4 hours Solu-Medrol  40 mg IV once daily DuoNebs every 4 hours Twice daily Brovana  Twice daily Pulmicort  Wean oxygen as tolerated Antitussives and mucolytic's Pulmonary consult requested  Dyslipidemia: Continue Crestor    Essential hypertension: PTA hydrochlorothiazide    Type 2 diabetes mellitus with peripheral neuropathy (HCC) Okay to resume Amaryl  and Neurontin    Morbid obesity: Complicating factor in overall care and prognosis    DVT prophylaxis: Lovenox  Code Status: Full Family Communication: None Disposition Plan: Status is: Inpatient Remains inpatient  appropriate because: Severe asthma exacerbation   Level of care: Telemetry Medical  Consultants:  None  Procedures:  None  Antimicrobials: None   Subjective: Seen and examined.  Lying in bed.  Still with significant cough, wheeze, shortness of breath  Objective: Vitals:   12/27/23 2034 12/28/23 0341 12/28/23 0435 12/28/23 0757  BP: (!) 140/71 (!) 157/90  (!) 148/70  Pulse: 71 68  65  Resp: 18 16  18   Temp: 97.7 F (36.5 C) 98.4 F (36.9 C)  98.1 F (36.7 C)  TempSrc:  Oral  Oral  SpO2: 96% 91% 96% 97%  Weight:      Height:        Intake/Output Summary (Last 24 hours) at 12/28/2023 1233 Last data filed at 12/28/2023 0900 Gross per 24 hour  Intake 480 ml  Output --  Net 480 ml   Filed Weights   12/23/23 1841  Weight: (!) 165.1 kg    Examination:  General exam: Appears winded and fatigued Respiratory system: Diffuse coarse wheeze in all lung fields.  2 L Cardiovascular system: S1-S2, RRR, no murmurs, no pedal edema Gastrointestinal system: Obese, soft, NT/ND, normal bowel sounds Central nervous system: Alert and oriented. No focal neurological deficits. Extremities: Symmetric 5 x 5 power. Skin: No rashes, lesions or ulcers Psychiatry: Judgement and insight appear normal. Mood & affect appropriate.     Data Reviewed: I have personally reviewed following labs and imaging studies  CBC: Recent Labs  Lab 12/23/23 1918 12/24/23 0424 12/26/23 0309  WBC 8.7 9.9 12.5*  NEUTROABS 5.6  --   --   HGB 13.6 12.3 12.1  HCT 43.4 38.5 38.1  MCV 83.8 81.7 82.3  PLT 282 283 291   Basic Metabolic Panel: Recent Labs  Lab 12/23/23 1918 12/24/23 0424 12/26/23 0309  NA 141 139 139  K 3.6 4.1 4.5  CL 103 104 101  CO2 26 25 29   GLUCOSE 132* 185* 107*  BUN 14 12 18   CREATININE 0.69 0.54 0.68  CALCIUM  9.0 8.9 9.1  MG  --   --  2.0  PHOS  --   --  2.5   GFR: Estimated Creatinine Clearance: 118.6 mL/min (by C-G formula based on SCr of 0.68 mg/dL). Liver  Function Tests: No results for input(s): AST, ALT, ALKPHOS, BILITOT, PROT, ALBUMIN in the last 168 hours. No results for input(s): LIPASE, AMYLASE in the last 168 hours. No results for input(s): AMMONIA in the last 168 hours. Coagulation Profile: No results for input(s): INR, PROTIME in the last 168 hours. Cardiac Enzymes: No results for input(s): CKTOTAL, CKMB, CKMBINDEX, TROPONINI in the last 168 hours. BNP (last 3 results) No results for input(s): PROBNP in the last 8760 hours. HbA1C: No results for input(s): HGBA1C in the last 72 hours.  CBG: Recent Labs  Lab 12/27/23 1147 12/27/23 1622 12/27/23 2033 12/28/23 0757 12/28/23 1200  GLUCAP 159* 128* 151* 126* 254*   Lipid Profile: No results for input(s): CHOL, HDL, LDLCALC, TRIG, CHOLHDL, LDLDIRECT in the last 72 hours. Thyroid Function Tests: No results for input(s): TSH, T4TOTAL, FREET4, T3FREE, THYROIDAB in the last 72 hours. Anemia Panel: No results for input(s): VITAMINB12, FOLATE, FERRITIN, TIBC, IRON, RETICCTPCT in the last 72 hours. Sepsis Labs: No results for input(s): PROCALCITON, LATICACIDVEN in the last 168 hours.  Recent Results (from the past 240 hours)  Resp panel by RT-PCR (RSV, Flu A&B, Covid) Anterior Nasal Swab     Status: None   Collection Time: 12/23/23  6:46 PM   Specimen: Anterior Nasal Swab  Result Value Ref Range Status   SARS Coronavirus 2 by RT PCR NEGATIVE NEGATIVE Final    Comment: (NOTE) SARS-CoV-2 target nucleic acids are NOT DETECTED.  The SARS-CoV-2 RNA is generally detectable in upper respiratory specimens during the acute phase of infection. The lowest concentration of SARS-CoV-2 viral copies this assay can detect is 138 copies/mL. A negative result does not preclude SARS-Cov-2 infection and should not be used as the sole basis for treatment or other patient management decisions. A negative result may occur with   improper specimen collection/handling, submission of specimen other than nasopharyngeal swab, presence of viral mutation(s) within the areas targeted by this assay, and inadequate number of viral copies(<138 copies/mL). A negative result must be combined with clinical observations, patient history, and epidemiological information. The expected result is Negative.  Fact Sheet for Patients:  BloggerCourse.com  Fact Sheet for Healthcare Providers:  SeriousBroker.it  This test is no t yet approved or cleared by the [place]  FDA and  has been authorized for detection and/or diagnosis of SARS-CoV-2 by FDA under an Emergency Use Authorization (EUA). This EUA will remain  in effect (meaning this test can be used) for the duration of the COVID-19 declaration under Section 564(b)(1) of the Act, 21 U.S.C.section 360bbb-3(b)(1), unless the authorization is terminated  or revoked sooner.       Influenza A by PCR NEGATIVE NEGATIVE Final   Influenza B by PCR NEGATIVE NEGATIVE Final    Comment: (NOTE) The Xpert Xpress SARS-CoV-2/FLU/RSV plus assay is intended as an aid in the diagnosis of influenza from Nasopharyngeal swab specimens and should not be used as a sole basis for treatment. Nasal washings and aspirates are unacceptable for Xpert Xpress SARS-CoV-2/FLU/RSV testing.  Fact Sheet for Patients: BloggerCourse.com  Fact Sheet for Healthcare Providers: SeriousBroker.it  This test is not yet approved or cleared by the [place]  FDA and has been authorized for detection and/or diagnosis of SARS-CoV-2 by FDA under an Emergency Use Authorization (EUA). This EUA will remain in effect (meaning this test can be used) for the duration of the COVID-19 declaration under Section 564(b)(1) of the Act, 21 U.S.C. section 360bbb-3(b)(1), unless the authorization is terminated or revoked.      Resp Syncytial Virus by PCR NEGATIVE NEGATIVE Final    Comment: (NOTE) Fact Sheet for Patients: BloggerCourse.com  Fact Sheet for Healthcare Providers: SeriousBroker.it  This test is not yet approved or cleared by the [place]  FDA and has been authorized for detection and/or diagnosis of SARS-CoV-2 by FDA under an Emergency Use Authorization (EUA). This EUA will remain in effect (meaning this test can be used) for the duration of the COVID-19 declaration under Section 564(b)(1) of the Act, 21 U.S.C. section 360bbb-3(b)(1), unless the authorization is terminated or revoked.  Performed at [facility], [address] [place], [place] [address]          Radiology Studies: No results found.      Scheduled Meds:  arformoterol   15 mcg Nebulization BID   baclofen   10 mg Oral QHS   benzonatate   200 mg Oral TID   budesonide  (PULMICORT ) nebulizer solution  0.25 mg Nebulization BID   chlorpheniramine-HYDROcodone   5 mL Oral QHS   enoxaparin  (LOVENOX ) injection  85 mg Subcutaneous Q24H   feeding supplement (GLUCERNA SHAKE)  237 mL Oral TID BM   gabapentin   300 mg Oral TID   glimepiride   1 mg Oral Q breakfast   guaiFENesin   600 mg Oral BID   hydrochlorothiazide   25 mg Oral Daily   insulin  aspart  0-20 Units Subcutaneous TID WC   insulin  aspart  0-5 Units Subcutaneous QHS   ipratropium-albuterol   3 mL Nebulization Q4H   methylPREDNISolone  (SOLU-MEDROL ) injection  40 mg Intravenous Q12H   rosuvastatin   40 mg Oral Daily   Continuous Infusions:   LOS: 4 days       Calvin KATHEE Robson, MD [facility]   If 7PM-7AM, please contact night-coverage  12/28/2023, 12:33 PM

## 2023-12-29 ENCOUNTER — Inpatient Hospital Stay

## 2023-12-29 DIAGNOSIS — J45901 Unspecified asthma with (acute) exacerbation: Secondary | ICD-10-CM | POA: Diagnosis not present

## 2023-12-29 LAB — GLUCOSE, CAPILLARY
Glucose-Capillary: 113 mg/dL — ABNORMAL HIGH (ref 70–99)
Glucose-Capillary: 180 mg/dL — ABNORMAL HIGH (ref 70–99)
Glucose-Capillary: 128 mg/dL — ABNORMAL HIGH (ref 70–99)
Glucose-Capillary: 130 mg/dL — ABNORMAL HIGH (ref 70–99)

## 2023-12-29 LAB — RESPIRATORY PANEL BY PCR

## 2023-12-29 LAB — TROPONIN I (HIGH SENSITIVITY)
Troponin I (High Sensitivity): 6 ng/L (ref <18)
Troponin I (High Sensitivity): 7 ng/L (ref <18)

## 2023-12-29 LAB — BLOOD GAS, ARTERIAL
Acid-Base Excess: 7.4 mmol/L — ABNORMAL HIGH (ref 0.0–2.0)
Bicarbonate: 33.2 mmol/L — ABNORMAL HIGH (ref 20.0–28.0)
O2 Content: 21 L/min
O2 Saturation: 93.1 %
Patient temperature: 37
pCO2 arterial: 50 mmHg — ABNORMAL HIGH (ref 32–48)
pH, Arterial: 7.43 (ref 7.35–7.45)
pO2, Arterial: 62 mmHg — ABNORMAL LOW (ref 83–108)

## 2023-12-29 LAB — BASIC METABOLIC PANEL WITH GFR
Anion gap: 15 (ref 5–15)
BUN: 23 mg/dL (ref 8–23)
CO2: 27 mmol/L (ref 22–32)
Calcium: 9 mg/dL (ref 8.9–10.3)
Chloride: 92 mmol/L — ABNORMAL LOW (ref 98–111)
Creatinine, Ser: 0.98 mg/dL (ref 0.44–1.00)
GFR, Estimated: >60 mL/min (ref >60)
Glucose, Bld: 213 mg/dL — ABNORMAL HIGH (ref 70–99)
Potassium: 3.8 mmol/L (ref 3.5–5.1)
Sodium: 134 mmol/L — ABNORMAL LOW (ref 135–145)

## 2023-12-29 LAB — BRAIN NATRIURETIC PEPTIDE: B Natriuretic Peptide: 26.5 pg/mL (ref 0.0–100.0)

## 2023-12-29 LAB — C-REACTIVE PROTEIN: CRP: 1.7 mg/dL — ABNORMAL HIGH (ref <1.0)

## 2023-12-29 LAB — D-DIMER, QUANTITATIVE: D-Dimer, Quant: 0.92 ug{FEU}/mL — ABNORMAL HIGH (ref 0.00–0.50)

## 2023-12-29 MED ORDER — FUROSEMIDE 10 MG/ML IJ SOLN
40.0000 mg | Freq: Every day | INTRAMUSCULAR | Status: DC
Start: 2023-12-29 — End: 2023-12-31
  Administered 2023-12-29 – 2023-12-31 (×3): 40 mg via INTRAVENOUS
  Filled 2023-12-29 (×3): qty 4

## 2023-12-29 MED ORDER — IPRATROPIUM-ALBUTEROL 0.5-2.5 (3) MG/3ML IN SOLN: 3.0000 mL | RESPIRATORY_TRACT | Status: DC | PRN

## 2023-12-29 MED ORDER — IOHEXOL 350 MG/ML SOLN
75.0000 mL | Freq: Once | INTRAVENOUS | Status: AC | PRN
  Administered 2023-12-29: 75 mL via INTRAVENOUS

## 2023-12-29 MED ORDER — IPRATROPIUM-ALBUTEROL 0.5-2.5 (3) MG/3ML IN SOLN
3.0000 mL | Freq: Four times a day (QID) | RESPIRATORY_TRACT | Status: DC
  Administered 2023-12-29 – 2023-12-31 (×6): 3 mL via RESPIRATORY_TRACT
  Filled 2023-12-29 (×6): qty 3

## 2023-12-29 MED ORDER — METHYLPREDNISOLONE SODIUM SUCC 40 MG IJ SOLR
40.0000 mg | Freq: Two times a day (BID) | INTRAMUSCULAR | Status: DC
  Administered 2023-12-29 – 2023-12-30 (×2): 40 mg via INTRAVENOUS
  Filled 2023-12-29 (×2): qty 1

## 2023-12-29 NOTE — Plan of Care (Signed)

## 2023-12-29 NOTE — Progress Notes (Signed)
 PROGRESS NOTE    Alexandra Macias  FMW:[id number] DOB: [birth year] DOA: 12/23/2023 PCP: Cyrus Selinda Moose, PA-C    Brief Narrative:   This 63 yrs old female with PMH significant for asthma, type 2 diabetes, hypertension, dyslipidemia and migraine presented in the ED with acute onset of shortness of breath associated with productive cough of yellowish sputum as well as wheezing for last few days. She also reports having some chest pain with coughing.She denies any fever but reports having significant chills.  In the ED She was hypoxic requiring 2 L of supplemental oxygen.  BNP 351.9, troponin 7.0, respiratory viral panel negative.  Chest x-ray shows no acute cardiopulmonary disease.  Patient was significantly wheezing.  Patient was given IV Solu-Medrol ,  IV magnesium , nebulized bronchodilators and patient was admitted for asthma exacerbation.   Assessment & Plan:   Principal Problem:   Asthma exacerbation Active Problems:   Dyslipidemia   Acute respiratory failure with hypoxia (HCC)   Essential hypertension   Type 2 diabetes mellitus with peripheral neuropathy (HCC)   Obesity, Class III, BMI 40-49.9 (morbid obesity)   Acute asthma exacerbation   History of DVT (deep vein thrombosis)  Acute hypoxic respiratory failure: Asthma exacerbation: Patient presented with productive cough associated with significant shortness of breath and wheezing.  Home inhalers ineffective.  Still wheezing Plan: MetaNeb every 4 hours Solu-Medrol  40 mg IV will increase to twice daily DuoNebs every 4 hours Twice daily Brovana  Twice daily Pulmicort  Wean oxygen as tolerated Antitussives and mucolytic's Pulm consult pending  Chest pain New today, with coughing, tender at sternum, suspect costochonidritis - f/u EKG, cxr, dimer, trop, bnp - consider nsaid for costochondritis if above w/u negative  Dyslipidemia: PTA Crestor    Essential hypertension: PTA hydrochlorothiazide    Type 2 diabetes mellitus  with peripheral neuropathy (HCC) Okay to resume Amaryl  and Neurontin  - daily fasting sugar   Morbid obesity: Complicating factor in overall care and prognosis    DVT prophylaxis: Lovenox  Code Status: Full Family Communication: None Disposition Plan: Status is: Inpatient Remains inpatient appropriate because: Severe asthma exacerbation   Level of care: Telemetry Medical  Consultants:  None  Procedures:  None  Antimicrobials: None   Subjective: Seen and examined.  Reports ongoing dyspnea, new chest pain substernal worse with coughing.  Objective: Vitals:   12/29/23 0422 12/29/23 0451 12/29/23 0728 12/29/23 1011  BP:  119/83 (!) 158/84 (!) 160/72  Pulse:  76 71 82  Resp:  20 16   Temp:  98.3 F (36.8 C) 97.6 F (36.4 C)   TempSrc:      SpO2: 97% 97% 95% 100%  Weight:      Height:        Intake/Output Summary (Last 24 hours) at 12/29/2023 1012 Last data filed at 12/28/2023 2020 Gross per 24 hour  Intake 480 ml  Output 700 ml  Net -220 ml   Filed Weights   12/23/23 1841  Weight: (!) 165.1 kg    Examination:  General exam: Appears uncomfortable Respiratory system: coarse breath sounds throughout Cardiovascular system: S1-S2, RRR, no murmurs, ttp around sternum Gastrointestinal system: Obese, soft, NT/ND, normal bowel sounds Central nervous system: Alert and oriented. No focal neurological deficits. Extremities: Symmetric 5 x 5 power. Skin: No rashes, lesions or ulcers Psychiatry: Judgement and insight appear normal. Mood & affect appropriate.     Data Reviewed: I have personally reviewed following labs and imaging studies  CBC: Recent Labs  Lab 12/23/23 1918 12/24/23 0424 12/26/23 0309  WBC 8.7  9.9 12.5*  NEUTROABS 5.6  --   --   HGB 13.6 12.3 12.1  HCT 43.4 38.5 38.1  MCV 83.8 81.7 82.3  PLT 282 283 291   Basic Metabolic Panel: Recent Labs  Lab 12/23/23 1918 12/24/23 0424 12/26/23 0309  NA 141 139 139  K 3.6 4.1 4.5  CL 103 104  101  CO2 26 25 29   GLUCOSE 132* 185* 107*  BUN 14 12 18   CREATININE 0.69 0.54 0.68  CALCIUM  9.0 8.9 9.1  MG  --   --  2.0  PHOS  --   --  2.5   GFR: Estimated Creatinine Clearance: 118.6 mL/min (by C-G formula based on SCr of 0.68 mg/dL). Liver Function Tests: No results for input(s): AST, ALT, ALKPHOS, BILITOT, PROT, ALBUMIN in the last 168 hours. No results for input(s): LIPASE, AMYLASE in the last 168 hours. No results for input(s): AMMONIA in the last 168 hours. Coagulation Profile: No results for input(s): INR, PROTIME in the last 168 hours. Cardiac Enzymes: No results for input(s): CKTOTAL, CKMB, CKMBINDEX, TROPONINI in the last 168 hours. BNP (last 3 results) No results for input(s): PROBNP in the last 8760 hours. HbA1C: No results for input(s): HGBA1C in the last 72 hours.  CBG: Recent Labs  Lab 12/28/23 0757 12/28/23 1200 12/28/23 1800 12/28/23 1946 12/29/23 0730  GLUCAP 126* 254* 83 106* 113*   Lipid Profile: No results for input(s): CHOL, HDL, LDLCALC, TRIG, CHOLHDL, LDLDIRECT in the last 72 hours. Thyroid Function Tests: No results for input(s): TSH, T4TOTAL, FREET4, T3FREE, THYROIDAB in the last 72 hours. Anemia Panel: No results for input(s): VITAMINB12, FOLATE, FERRITIN, TIBC, IRON, RETICCTPCT in the last 72 hours. Sepsis Labs: No results for input(s): PROCALCITON, LATICACIDVEN in the last 168 hours.  Recent Results (from the past 240 hours)  Resp panel by RT-PCR (RSV, Flu A&B, Covid) Anterior Nasal Swab     Status: None   Collection Time: 12/23/23  6:46 PM   Specimen: Anterior Nasal Swab  Result Value Ref Range Status   SARS Coronavirus 2 by RT PCR NEGATIVE NEGATIVE Final    Comment: (NOTE) SARS-CoV-2 target nucleic acids are NOT DETECTED.  The SARS-CoV-2 RNA is generally detectable in upper respiratory specimens during the acute phase of infection. The lowest concentration of  SARS-CoV-2 viral copies this assay can detect is 138 copies/mL. A negative result does not preclude SARS-Cov-2 infection and should not be used as the sole basis for treatment or other patient management decisions. A negative result may occur with  improper specimen collection/handling, submission of specimen other than nasopharyngeal swab, presence of viral mutation(s) within the areas targeted by this assay, and inadequate number of viral copies(<138 copies/mL). A negative result must be combined with clinical observations, patient history, and epidemiological information. The expected result is Negative.  Fact Sheet for Patients:  BloggerCourse.com  Fact Sheet for Healthcare Providers:  SeriousBroker.it  This test is no t yet approved or cleared by the [place]  FDA and  has been authorized for detection and/or diagnosis of SARS-CoV-2 by FDA under an Emergency Use Authorization (EUA). This EUA will remain  in effect (meaning this test can be used) for the duration of the COVID-19 declaration under Section 564(b)(1) of the Act, 21 U.S.C.section 360bbb-3(b)(1), unless the authorization is terminated  or revoked sooner.       Influenza A by PCR NEGATIVE NEGATIVE Final   Influenza B by PCR NEGATIVE NEGATIVE Final    Comment: (NOTE) The Xpert Xpress SARS-CoV-2/FLU/RSV  plus assay is intended as an aid in the diagnosis of influenza from Nasopharyngeal swab specimens and should not be used as a sole basis for treatment. Nasal washings and aspirates are unacceptable for Xpert Xpress SARS-CoV-2/FLU/RSV testing.  Fact Sheet for Patients: BloggerCourse.com  Fact Sheet for Healthcare Providers: SeriousBroker.it  This test is not yet approved or cleared by the [place]  FDA and has been authorized for detection and/or diagnosis of SARS-CoV-2 by FDA under an Emergency Use  Authorization (EUA). This EUA will remain in effect (meaning this test can be used) for the duration of the COVID-19 declaration under Section 564(b)(1) of the Act, 21 U.S.C. section 360bbb-3(b)(1), unless the authorization is terminated or revoked.     Resp Syncytial Virus by PCR NEGATIVE NEGATIVE Final    Comment: (NOTE) Fact Sheet for Patients: BloggerCourse.com  Fact Sheet for Healthcare Providers: SeriousBroker.it  This test is not yet approved or cleared by the [place]  FDA and has been authorized for detection and/or diagnosis of SARS-CoV-2 by FDA under an Emergency Use Authorization (EUA). This EUA will remain in effect (meaning this test can be used) for the duration of the COVID-19 declaration under Section 564(b)(1) of the Act, 21 U.S.C. section 360bbb-3(b)(1), unless the authorization is terminated or revoked.  Performed at [facility], [address] [place], [place] [address]          Radiology Studies: No results found.      Scheduled Meds:  arformoterol   15 mcg Nebulization BID   baclofen   10 mg Oral QHS   benzonatate   200 mg Oral TID   budesonide  (PULMICORT ) nebulizer solution  0.25 mg Nebulization BID   chlorpheniramine-HYDROcodone   5 mL Oral QHS   enoxaparin  (LOVENOX ) injection  85 mg Subcutaneous Q24H   feeding supplement (GLUCERNA SHAKE)  237 mL Oral TID BM   gabapentin   300 mg Oral TID   glimepiride   1 mg Oral Q breakfast   guaiFENesin   600 mg Oral BID   hydrochlorothiazide   25 mg Oral Daily   insulin  aspart  0-20 Units Subcutaneous TID WC   insulin  aspart  0-5 Units Subcutaneous QHS   ipratropium-albuterol   3 mL Nebulization Q4H   methylPREDNISolone  (SOLU-MEDROL ) injection  40 mg Intravenous Daily   rosuvastatin   40 mg Oral Daily   Continuous Infusions:   LOS: 5 days       Devaughn KATHEE Ban, MD [facility]   If 7PM-7AM, please contact  night-coverage  12/29/2023, 10:12 AM

## 2023-12-29 NOTE — Plan of Care (Signed)
 Patient seems to be more uncomfortable today and coughing more. MD aware and additional test being run.   Problem: Education: Goal: Ability to describe self-care measures that may prevent or decrease complications (Diabetes Survival Skills Education) will improve Outcome: Progressing Goal: Individualized Educational Video(s) Outcome: Progressing   Problem: Fluid Volume: Goal: Ability to maintain a balanced intake and output will improve Outcome: Progressing   Problem: Health Behavior/Discharge Planning: Goal: Ability to identify and utilize available resources and services will improve Outcome: Progressing   Problem: Metabolic: Goal: Ability to maintain appropriate glucose levels will improve Outcome: Progressing   Problem: Nutritional: Goal: Maintenance of adequate nutrition will improve Outcome: Progressing Goal: Progress toward achieving an optimal weight will improve Outcome: Progressing   Problem: Skin Integrity: Goal: Risk for impaired skin integrity will decrease Outcome: Progressing   Problem: Tissue Perfusion: Goal: Adequacy of tissue perfusion will improve Outcome: Progressing   Problem: Education: Goal: Knowledge of General Education information will improve Description: Including pain rating scale, medication(s)/side effects and non-pharmacologic comfort measures Outcome: Progressing   Problem: Clinical Measurements: Goal: Ability to maintain clinical measurements within normal limits will improve Outcome: Progressing Goal: Will remain free from infection Outcome: Progressing Goal: Diagnostic test results will improve Outcome: Progressing Goal: Cardiovascular complication will be avoided Outcome: Progressing   Problem: Activity: Goal: Risk for activity intolerance will decrease Outcome: Progressing   Problem: Nutrition: Goal: Adequate nutrition will be maintained Outcome: Progressing   Problem: Elimination: Goal: Will not experience complications  related to bowel motility Outcome: Progressing Goal: Will not experience complications related to urinary retention Outcome: Progressing   Problem: Pain Managment: Goal: General experience of comfort will improve and/or be controlled Outcome: Progressing   Problem: Safety: Goal: Ability to remain free from injury will improve Outcome: Progressing   Problem: Skin Integrity: Goal: Risk for impaired skin integrity will decrease Outcome: Progressing

## 2023-12-29 NOTE — Consult Note (Signed)
 PULMONOLOGY         Date: 12/29/2023,   MRN# [id number] Alexandra Macias 1960/08/30     AdmissionWeight: (!) 165.1 kg                 CurrentWeight: (!) 165.1 kg  Referring provider: Dr Kandis   CHIEF COMPLAINT:   Acute Asthma exacerbation   HISTORY OF PRESENT ILLNESS   63 yo F with severe persistent asthma, HTN, DM, dyslpidemia who came in with respiratory distress and asthma exacerbation.  She had severe chest pain which has now improved from 10 to 2.  She required 2L/min [place] on arrival.  Her cardiac biomarkers were elevated. She also appeared to have hypercarbia peripherally.  She is being treated for Asthma exacerbation with steroids and nebulizer.  She reports recurrent pedal edema.    PAST MEDICAL HISTORY   Past Medical History:  Diagnosis Date   Asthma    Diabetes mellitus without complication (HCC)    DVT (deep venous thrombosis) (HCC) 2005   post op hysterectomy   Headache    History of Clostridium difficile infection    History of migraine    Hyperlipidemia    Hypertension      SURGICAL HISTORY   Past Surgical History:  Procedure Laterality Date   ABDOMINAL HYSTERECTOMY  04/07/2003   FOOT FUSION  04/06/2013   right-fx   FRACTURE SURGERY     HARDWARE REMOVAL Right 07/05/2014   Procedure: REMOVAL OF DEEP IMPLANTS OF RIGHT ANKLE;  Surgeon: [name], MD;  Location:  SURGERY CENTER;  Service: Orthopedics;  Laterality: Right;   ORIF FOOT FRACTURE  04/06/2008   left   ORIF WRIST FRACTURE     both rt and lt     FAMILY HISTORY   Family History  Problem Relation Age of Onset   Hypertension Mother    Hypertension Father    Heart attack Father    Hypertension Sister    Breast cancer Neg Hx      SOCIAL HISTORY   Social History   Tobacco Use   Smoking status: Former    Current packs/day: 0.00    Types: Cigarettes    Quit date: 12/26/2013    Years since quitting: 10.0   Smokeless tobacco: Never  Vaping Use   Vaping status:  Never Used  Substance Use Topics   Alcohol use: Not Currently    Comment: rare   Drug use: No     MEDICATIONS    Home Medication:    Current Medication:  Current Facility-Administered Medications:    acetaminophen  (TYLENOL ) tablet 650 mg, 650 mg, Oral, Q6H PRN, 650 mg at 12/26/23 0804 **OR** acetaminophen  (TYLENOL ) suppository 650 mg, 650 mg, Rectal, Q6H PRN, [name], [name], MD   alum & mag hydroxide-simeth (MAALOX/MYLANTA) 200-200-20 MG/5ML suspension 30 mL, 30 mL, Oral, Q6H PRN, [name], [name], MD, 30 mL at 12/28/23 0049   arformoterol  (BROVANA ) nebulizer solution 15 mcg, 15 mcg, Nebulization, BID, Sreenath, [name], MD, 15 mcg at 12/29/23 9268   baclofen  (LIORESAL ) tablet 10 mg, 10 mg, Oral, QHS, [name], [name], MD, 10 mg at 12/28/23 2123   benzonatate  (TESSALON ) capsule 200 mg, 200 mg, Oral, TID, [name], [name], MD, 200 mg at 12/29/23 9180   budesonide  (PULMICORT ) nebulizer solution 0.25 mg, 0.25 mg, Nebulization, BID, Sreenath, [name], MD, 0.25 mg at 12/29/23 0731   chlorpheniramine-HYDROcodone  (TUSSIONEX) 10-8 MG/5ML suspension 5 mL, 5 mL, Oral, QHS, Sreenath, [name], MD, 5  mL at 12/28/23 2123   enoxaparin  (LOVENOX ) injection 85 mg, 85 mg, Subcutaneous, Q24H, [name], [name], MD, 85 mg at 12/29/23 0818   feeding supplement (GLUCERNA SHAKE) (GLUCERNA SHAKE) liquid 237 mL, 237 mL, Oral, TID BM, Sreenath, [name], MD, 237 mL at 12/29/23 9178   furosemide  (LASIX ) tablet 20 mg, 20 mg, Oral, Daily PRN, [name], [name], MD   gabapentin  (NEURONTIN ) capsule 300 mg, 300 mg, Oral, TID, [name], [name], MD, 300 mg at 12/29/23 9180   glimepiride  (AMARYL ) tablet 1 mg, 1 mg, Oral, Q breakfast, [name], [name], MD, 1 mg at 12/29/23 0818   guaiFENesin  (MUCINEX ) 12 hr tablet 600 mg, 600 mg, Oral, BID, [name], [name], MD, 600 mg at 12/29/23 9180   guaiFENesin -dextromethorphan  (ROBITUSSIN DM) 100-10 MG/5ML syrup 5 mL, 5 mL, Oral, Q4H PRN, [name], MD, 5 mL at 12/29/23 9095    hydrochlorothiazide  (HYDRODIURIL ) tablet 25 mg, 25 mg, Oral, Daily, [name], [name], MD, 25 mg at 12/29/23 9180   insulin  aspart (novoLOG ) injection 0-20 Units, 0-20 Units, Subcutaneous, TID WC, [name], MD, 11 Units at 12/28/23 1219   insulin  aspart (novoLOG ) injection 0-5 Units, 0-5 Units, Subcutaneous, QHS, [name], [name], MD, 2 Units at 12/26/23 2145   ipratropium-albuterol  (DUONEB) 0.5-2.5 (3) MG/3ML nebulizer solution 3 mL, 3 mL, Nebulization, Q4H, Sreenath, [name], MD, 3 mL at 12/29/23 0731   magnesium  hydroxide (MILK OF MAGNESIA) suspension 30 mL, 30 mL, Oral, Daily PRN, [name], [name], MD   methylPREDNISolone  sodium succinate (SOLU-MEDROL ) 40 mg/mL injection 40 mg, 40 mg, Intravenous, Q12H, Wouk, Alexandra Sayres, MD   ondansetron  (ZOFRAN ) tablet 4 mg, 4 mg, Oral, Q6H PRN **OR** ondansetron  (ZOFRAN ) injection 4 mg, 4 mg, Intravenous, Q6H PRN, [name], [name], MD   oxyCODONE  (Oxy IR/ROXICODONE ) immediate release tablet 5-10 mg, 5-10 mg, Oral, Q4H PRN, [name], [name], MD, 5 mg at 12/29/23 9096   rosuvastatin  (CRESTOR ) tablet 40 mg, 40 mg, Oral, Daily, [name], [name], MD, 40 mg at 12/29/23 9180   traZODone  (DESYREL ) tablet 25 mg, 25 mg, Oral, QHS PRN, [name], [name], MD    ALLERGIES   Metformin and Lisinopril      REVIEW OF SYSTEMS    Review of Systems:  Gen:  Denies  fever, sweats, chills weigh loss  HEENT: Denies blurred vision, double vision, ear pain, eye pain, hearing loss, nose bleeds, sore throat Cardiac:  No dizziness, chest pain or heaviness, chest tightness,edema Resp:   reports dyspnea chronically  Gi: Denies swallowing difficulty, stomach pain, nausea or vomiting, diarrhea, constipation, bowel incontinence Gu:  Denies bladder incontinence, burning urine Ext:   Denies Joint pain, stiffness or swelling Skin: Denies  skin rash, easy bruising or bleeding or hives Endoc:  Denies polyuria, polydipsia , polyphagia or weight change Psych:   Denies depression, insomnia or  hallucinations   Other:  All other systems negative   VS: BP (!) 160/72   Pulse 82   Temp 97.6 F (36.4 C)   Resp 16   Ht 5' 7 (1.702 m)   Wt (!) 165.1 kg   SpO2 100%   BMI 57.01 kg/m      PHYSICAL EXAM    GENERAL:NAD, no fevers, chills, no weakness no fatigue HEAD: Normocephalic, atraumatic.  EYES: Pupils equal, round, reactive to light. Extraocular muscles intact. No scleral icterus.  MOUTH: Moist mucosal membrane. Dentition intact. No abscess noted.  EAR, NOSE, THROAT: Clear without exudates. No external lesions.  NECK: Supple. No thyromegaly. No nodules.  No JVD.  PULMONARY: decreased breath sounds with mild rhonchi worse at bases bilaterally.  CARDIOVASCULAR: S1 and S2. Regular rate and rhythm. No murmurs, rubs, or gallops. No edema. Pedal pulses 2+ bilaterally.  GASTROINTESTINAL: Soft, nontender, nondistended. No masses. Positive bowel sounds. No hepatosplenomegaly.  MUSCULOSKELETAL: No swelling, clubbing, or edema. Range of motion full in all extremities.  NEUROLOGIC: Cranial nerves II through XII are intact. No gross focal neurological deficits. Sensation intact. Reflexes intact.  SKIN: No ulceration, lesions, rashes, or cyanosis. Skin warm and dry. Turgor intact.  PSYCHIATRIC: Mood, affect within normal limits. The patient is awake, alert and oriented x 3. Insight, judgment intact.       IMAGING     Narrative & Impression  CLINICAL DATA:  Pleuritic chest pain.   EXAM: PORTABLE CHEST 1 VIEW   COMPARISON:  12/23/2023   FINDINGS: Lungs are hypoinflated without focal airspace consolidation or effusion. Cardiomediastinal silhouette and remainder of the exam is unchanged.   IMPRESSION: Hypoinflation without acute cardiopulmonary disease.     Electronically Signed   By: Toribio Agreste M.D.   On: 12/29/2023 11:35        ASSESSMENT/PLAN   Acute asthma exacerbation - severe      - RSV/COVID/FLU negative      - RVP ordered       - CRP ordered         - lasix  increased due to interstitial edem      - continue current solumedrol at 40 iv bid    Atelectasis     Chest physiotherapy TID with metaneb   OSA/OHS overlap    - ABG for BIPAP qualification              Thank you for allowing me to participate in the care of this patient.   Patient/Family are satisfied with care plan and all questions have been answered.    Provider disclosure: Patient with at least one acute or chronic illness or injury that poses a threat to life or bodily function and is being managed actively during this encounter.  All of the below services have been performed independently by signing provider:  review of prior documentation from internal and or external health records.  Review of previous and current lab results.  Interview and comprehensive assessment during patient visit today. Review of current and previous chest radiographs/CT scans. Discussion of management and test interpretation with health care team and patient/family.   This document was prepared using Dragon voice recognition software and may include unintentional dictation errors.     Ganon Demasi, M.D.  Division of Pulmonary & Critical Care Medicine

## 2023-12-29 NOTE — Progress Notes (Signed)
 Patient started having 10/10 chest pain and in tears, [place] 2L applied. Full set of vitals obtained. MD notified. EKG obtained and put in chart. MD in room. Patient stated it was from coughing and it was hurting around the rib cage.   MD stated no O2 needed unless hypoxic.   Patient seems to be more calm and coughing less after about 10 minutes in the room with slow breathing exercises.

## 2023-12-30 DIAGNOSIS — J45901 Unspecified asthma with (acute) exacerbation: Secondary | ICD-10-CM | POA: Diagnosis not present

## 2023-12-30 LAB — GLUCOSE, CAPILLARY
Glucose-Capillary: 182 mg/dL — ABNORMAL HIGH (ref 70–99)
Glucose-Capillary: 210 mg/dL — ABNORMAL HIGH (ref 70–99)
Glucose-Capillary: 131 mg/dL — ABNORMAL HIGH (ref 70–99)

## 2023-12-30 MED ORDER — METHYLPREDNISOLONE SODIUM SUCC 40 MG IJ SOLR
40.0000 mg | INTRAMUSCULAR | Status: DC
Start: 2023-12-31 — End: 2023-12-31
  Administered 2023-12-31: 40 mg via INTRAVENOUS
  Filled 2023-12-30: qty 1

## 2023-12-30 NOTE — Discharge Instructions (Signed)
 ..Agency Name: [facility] Agency Address: [address] [place], [place] [address] Phone: [phone] Website: [email or web address] Service(s) Offered: Housing services, self-sufficiency, congregate meal program, and individual development account program.  Agency Name: [employer] [facility] [place] Address: [address], [place], [place] [address] Phone: [phone] Email: [email or web address] Website: [email or web address] Service(s) Offered: Housing the homeless, feeding the hungry, Company secretary, job and education related services.  Agency Name: [facility] Address: [address], [place], [place] [address] Phone: [phone] Email: [email or web address] Service(s) Offered: Counseling, problem pregnancy, advocacy for Hispanics, limited emergency financial assistance.  Agency Name: [facility] Address: [address] [place], [place] [address] Phone: [phone] Website: [email or web address] Service(s) Offered: Child support services; child welfare services; SNAP; Medicaid; work first family assistance; and aid with fuel,  rent, food and medicine.  Agency Name: [facility] Address: [address] [place], [place] [address] Phone: [phone] or [phone] Email: [email or web address] Service(s) Offered: Family services and transient assistance; emergency food, fuel, clothing, limited furniture, utilities; budget counseling, general counseling; give a kid a coat; thrift store; Christmas food and toys. Utility assistance, food pantry, rental  assistance, life sustaining medicine .SABRARent/Utility/Housing  Agency Name: [facility] Agency Address: [address] [place], [place] [address] Phone: [phone] Email: [email or web address] Website: [email or web address] Service(s) Offered: Housing services, self-sufficiency, congregate meal  program, weatherization program, Field seismologist program, emergency food assistance,  housing counseling, home ownership program, wheels -towork program.  Agency Name: [facility] Address: [address] [place], [place] [address] Phone: [phone] (8a-4p) [phone] (8p- 10p) Email: [email or web address] Website: [email or web address] Service(s) Offered: A program for homeless and/or needy men that includes one-on-one counseling, life skills training and job rehabilitation.  Agency Name: [employer] [facility] [place] Address: [address], [place], [place] [address] Phone: [phone] Website: www.[email or web address].org Service(s) Offered: Assistance to needy in emergency with utility bills, heating fuel, and prescriptions. Shelter for homeless 7pm-7am. July 30, 2016 15  Agency Name: [facility] [place] [facility] Address: [address], [place], [place] [address] Phone: [phone] Contact Person: Lemond Cart Email: [email or web address] Website: [email or web address] Service(s) Offered: Helps individuals with developmental disabilities move from housing that is more restrictive to homes where they  can achieve greater independence and have more  opportunities.  Agency Name: [facility] Address: [address], [place], [place] [address] Phone: [phone] Email: [email or web address] Website: [email or web address].org Service(s) Offered: Provides affordable housing for low-income families, elderly, and disabled individuals. Offer a wide range of  programs and services, from financial planning to afterschool and summer programs.  Agency Name: Department of Social Services Address: [address] [place], [place] [address] Phone: [phone] Service(s) Offered: Child support services; child welfare services; food stamps; Medicaid; work first family assistance; and aid with  fuel,  rent, food and medicine.  SABRA.Food Resources  Agency Name: [facility] Agency Address: [address] [place], [place] [address] Phone: [phone] Website: www.[email or web address].org Service(s) Offered: Housing services, self-sufficiency, congregate meal program, weatherization program, Event organiser program, emergency food assistance,  housing counseling, home ownership program, wheels - to work program.  [employer] free for 60 and older at various locations from [employer], Monday-Friday:  [employer], [address] [place], [phone] -[facility], [address], [place] [phone]  -[facility], [address], [place] [phone]  -[address], [place], [phone]  Agency Name: [facility] Address: [phone] [address], [place], [place] [address] Phone:  [phone] Website: www.[email or web address] Service(s) Offered: Home delivered hot, frozen, and emergency  meals. Grocery assistance program which matches  volunteers one-on-one with seniors unable to grocery shop  for themselves. Must be 60 years and older; less than 20  hours of in-home aide service, limited or no driving ability;  live alone or with someone with a disability; live in  [place].  Agency Name: [facility]) Address: [address], [place], [place] [address] Phone: [phone] Service(s) Offered: Food is served to shut-ins, homeless, elderly, and low income people in the community every Saturday (11:30 am-12:30 pm) and Sunday (12:30 pm-1:30pm). Volunteers also offer help and encouragement in seeking employment,  and spiritual guidance.  Agency Name: Department of Social Services Address: [address] [place], [place] [address] Phone: [phone] Service(s) Offered: Child support services; child welfare services; food stamps; Medicaid;  work first family assistance; and aid with fuel,  rent, food and medicine.  Agency Name: [facility] Address: [address] [place], [place] Phone: [phone] Website: [email or web address] Services Offered: Monday 10:00am-12:00, 8:00pm-9:00pm, and Friday 10:00am-12:00.  Agency Name: [employer] [facility] [place] Address: [address] [place], [place] [address] Phone: [phone] Website: www.[email or web address].org Service(s) Offered: Serves weekday meals, open from 11:30 am- 1:00 pm., and 6:30-7:30pm, Monday-Wednesday-Friday distributes food 3:30-6pm, Monday-Wednesday-Friday.  Agency Name: [facility] Address: [address] [place], [place] Phone: [phone] Website: [email or web address] Services Offered: Distributes food the 4th Saturday of the month, starting at 8:00 am  Agency Name: [facility] Address: [phone] [address] [place], [place] [address] Phone: [phone] Website: [email or web address] Service(s) Offered: Bread of life, weekly food pantry. Open Wednesdays from 10:00am-noon.  Agency Name: [facility] [employer] Address: [address] [place], [place], [place] Phone: [phone] Services Offered: Distributes food 9am-1pm, Monday-Thursday. Call for details.  Agency Name: [facility] Address: [address] [place], [place] [address] Phone: [phone] Website: firstbaptistburlington.[email or web address] Service(s) Offered: Games developer. Call for assistance.  Agency Name: [facility] Address: [address], [place], [place] [address] Phone: [phone] Service Offered: Emergency Food Pantry. Call for appointment.  Agency Name: [facility] Address: [address], [place], [place] [address] Phone: [phone] Website: [email or web address] Services Offered: Games developer. Call for details  Agency Name: [facility] Address:  [address] [place], [place] Phone: [phone] Website: [email or web address] Service(s) Offered: Emergency Food Pantry. Call for details.  Agency Name: [facility] Address: [address], [place], [place] [address] Phone: [phone] or [phone] Website: [email or web address] Service(s) Offered: Distribute food 9am-11:30 am, Tuesday-Friday, and 1-3:30pm, Monday-Friday. Food pantry Monday-Friday 1pm-3pm, fresh items, Mon.-Wed.-Fri.  Agency Name: [facility] Address: [address] [place], [place] [address] Phone: [phone] Website: www.[email or web address].org Services Offered: Distribute food Tues and Sats from 9:00am-noon. Closed 1st Saturday of each month. Call for details  Agency Name: [employer] Address: [facility] [address] [place] [address] Phone: [phone]  Services Offered: Delivers meals every Thursday Agency Name: [facility] [place] [employer]. Address: [facility] [address], [place], [place]  [address] Phone: [phone] Website: www.familyabuseservices.org Service(s) Offered: 24 hour Crisis Line: [phone]; 24 hour Emergency Shelter; Transitional Housing; Support Groups; Scientist, physiological; [employer]; Hispanic Outreach: [phone];  Visitation Center: [phone].  Agency Name: [facility] [place]. Address: [address] [place], [place] [address] Phone: [phone] Service(s) Offered: CAP Services; Home and [employer]; Individual or Group Supports; Respite  Care Non-Institutional Nursing;  Residential Supports; Respite Care and Personal Care Services; Transportation; Family and Friends Night; Recreational Activities; Three Nutritious Meals/Snacks; Consultation with Registered Dietician; Twenty-four hour Registered Nurse Access; Daily and [employer]; Camp Green Leaves; [place] for the [employer] (During Summer  Months) Bingo Night (Every  Wednesday Night); Special Populations Dance Night  (Every Tuesday Night); Professional Hair Care Services.  Agency Name: [facility] Address: [address], [place], [place] [address] Phone: [phone] Contact Person: Meade High Website: [email or web address].Physicist, medical) Offered: Residential treatment facility for women; food and  clothing, educational & employment development and  transportation to work; Counsellor of financial skills;  parenting and family reunification; emotional and spiritual  support; transitional housing for program graduates.  Agency Name: [facility] Address: [address], [place], [place] [address] Phone: [phone] Email: [email or web address] Website: [email or web address].es Service(s) Offered: Public housing units for elderly, disabled, and low income people; housing choice vouchers for income eligible  applicants; shelter plus care vouchers; and Psychologist, clinical.  Agency Name: [facility] [employer] Address: [address], [place], [place] [address] Phone: [phone] Email: [email or web address] Website: www.habitatalamance.org Service(s) Offered: Build houses for families in need of decent housing. Each adult in the family must invest 200 hours of labor on  someone else's house, work with volunteers to build their own house, attend classes on budgeting, home maintenance, yard care, and attend homeowner association meetings.  Agency Name: [facility] Address: [address], [place], [place] [address] Phone: [phone] Website: www.rsli.org Service(s) Offered: Intermediate care facilities for intellectually delayed, Supervised Living in group homes for adults with developmental disabilities, Supervised Living for people who have dual diagnoses (MRMI), Independent Living, Supported Living, respite and a variety of CAP services,  pre-vocational services, day supports, and [employer].  Agency Name: [place] [employer] Phone: [phone] Website: www.NCForeclosurePrevention.gov Service(s) Offered: Zero-interest, deferred loans to homeowners struggling to pay their mortgage. Call for more information.

## 2023-12-30 NOTE — Progress Notes (Signed)
 PULMONOLOGY         Date: 12/30/2023,   MRN# [id number] Alexandra Macias [birth year]     AdmissionWeight: (!) 165.1 kg                 CurrentWeight: (!) 165.1 kg  Referring provider: Dr Kandis   CHIEF COMPLAINT:   Acute Asthma exacerbation   HISTORY OF PRESENT ILLNESS   63 yo F with severe persistent asthma, HTN, DM, dyslpidemia who came in with respiratory distress and asthma exacerbation.  She had severe chest pain which has now improved from 10 to 2.  She required 2L/min [place] on arrival.  Her cardiac biomarkers were elevated. She also appeared to have hypercarbia peripherally.  She is being treated for Asthma exacerbation with steroids and nebulizer.  She reports recurrent pedal edema.    12/30/23- patient is + for rhinovirus which is likely cause of current Asthma exacerbation.  She still has increased inflammatory biomarkers likely due to asthma exacerbation.  She had ABG with hypoxemic hypercapnic respiratory failure and compensation suggestive of chronic CO2 retention likely due to obesity hypoventilation. Overall clinically improved on room air now. Have reduced solumedrol from 40 BID IV to once daily.  Renal function is stable and patient had good urine output will continue lasixt at this time.   PAST MEDICAL HISTORY   Past Medical History:  Diagnosis Date   Asthma    Diabetes mellitus without complication (HCC)    DVT (deep venous thrombosis) (HCC) 2005   post op hysterectomy   Headache    History of Clostridium difficile infection    History of migraine    Hyperlipidemia    Hypertension      SURGICAL HISTORY   Past Surgical History:  Procedure Laterality Date   ABDOMINAL HYSTERECTOMY  04/07/2003   FOOT FUSION  04/06/2013   right-fx   FRACTURE SURGERY     HARDWARE REMOVAL Right 07/05/2014   Procedure: REMOVAL OF DEEP IMPLANTS OF RIGHT ANKLE;  Surgeon: [name], MD;  Location: [facility];  Service: Orthopedics;  Laterality: Right;    ORIF FOOT FRACTURE  04/06/2008   left   ORIF WRIST FRACTURE     both rt and lt     FAMILY HISTORY   Family History  Problem Relation Age of Onset   Hypertension Mother    Hypertension Father    Heart attack Father    Hypertension Sister    Breast cancer Neg Hx      SOCIAL HISTORY   Social History   Tobacco Use   Smoking status: Former    Current packs/day: 0.00    Types: Cigarettes    Quit date: 12/26/2013    Years since quitting: 10.0   Smokeless tobacco: Never  Vaping Use   Vaping status: Never Used  Substance Use Topics   Alcohol use: Not Currently    Comment: rare   Drug use: No     MEDICATIONS     Current Medication:  Current Facility-Administered Medications:    acetaminophen  (TYLENOL ) tablet 650 mg, 650 mg, Oral, Q6H PRN, 650 mg at 12/26/23 0804 **OR** acetaminophen  (TYLENOL ) suppository 650 mg, 650 mg, Rectal, Q6H PRN, [name], [name], MD   alum & mag hydroxide-simeth (MAALOX/MYLANTA) 200-200-20 MG/5ML suspension 30 mL, 30 mL, Oral, Q6H PRN, [name], [name], MD, 30 mL at 12/28/23 0049   arformoterol  (BROVANA ) nebulizer solution 15 mcg, 15 mcg, Nebulization, BID, Sreenath, [name], MD, 15 mcg at 12/30/23 0725  baclofen  (LIORESAL ) tablet 10 mg, 10 mg, Oral, QHS, [name], [name], MD, 10 mg at 12/29/23 2251   benzonatate  (TESSALON ) capsule 200 mg, 200 mg, Oral, TID, [name], [name], MD, 200 mg at 12/30/23 9096   budesonide  (PULMICORT ) nebulizer solution 0.25 mg, 0.25 mg, Nebulization, BID, Sreenath, [name], MD, 0.25 mg at 12/30/23 0725   chlorpheniramine-HYDROcodone  (TUSSIONEX) 10-8 MG/5ML suspension 5 mL, 5 mL, Oral, QHS, Sreenath, [name], MD, 5 mL at 12/29/23 2250   enoxaparin  (LOVENOX ) injection 85 mg, 85 mg, Subcutaneous, Q24H, [name], [name], MD, 85 mg at 12/30/23 9085   feeding supplement (GLUCERNA SHAKE) (GLUCERNA SHAKE) liquid 237 mL, 237 mL, Oral, TID BM, Sreenath, [name], MD, 237 mL at 12/30/23 0915   furosemide  (LASIX ) injection 40 mg,  40 mg, Intravenous, Daily, [name], MD, 40 mg at 12/30/23 9096   furosemide  (LASIX ) tablet 20 mg, 20 mg, Oral, Daily PRN, [name], [name], MD   gabapentin  (NEURONTIN ) capsule 300 mg, 300 mg, Oral, TID, [name], [name], MD, 300 mg at 12/30/23 9096   glimepiride  (AMARYL ) tablet 1 mg, 1 mg, Oral, Q breakfast, [name], [name], MD, 1 mg at 12/30/23 9086   guaiFENesin  (MUCINEX ) 12 hr tablet 600 mg, 600 mg, Oral, BID, [name], [name], MD, 600 mg at 12/30/23 9096   guaiFENesin -dextromethorphan  (ROBITUSSIN DM) 100-10 MG/5ML syrup 5 mL, 5 mL, Oral, Q4H PRN, [name], [name], MD, 5 mL at 12/29/23 1728   hydrochlorothiazide  (HYDRODIURIL ) tablet 25 mg, 25 mg, Oral, Daily, [name], [name], MD, 25 mg at 12/30/23 9051   insulin  aspart (novoLOG ) injection 0-20 Units, 0-20 Units, Subcutaneous, TID WC, [name], MD, 4 Units at 12/30/23 1232   insulin  aspart (novoLOG ) injection 0-5 Units, 0-5 Units, Subcutaneous, QHS, [name], [name], MD, 2 Units at 12/26/23 2145   ipratropium-albuterol  (DUONEB) 0.5-2.5 (3) MG/3ML nebulizer solution 3 mL, 3 mL, Nebulization, QID, Kayton Ripp, MD, 3 mL at 12/30/23 1207   ipratropium-albuterol  (DUONEB) 0.5-2.5 (3) MG/3ML nebulizer solution 3 mL, 3 mL, Nebulization, Q4H PRN, [name], MD   magnesium  hydroxide (MILK OF MAGNESIA) suspension 30 mL, 30 mL, Oral, Daily PRN, [name], [name], MD   methylPREDNISolone  sodium succinate (SOLU-MEDROL ) 40 mg/mL injection 40 mg, 40 mg, Intravenous, Q12H, Wouk, [name], MD, 40 mg at 12/30/23 9097   ondansetron  (ZOFRAN ) tablet 4 mg, 4 mg, Oral, Q6H PRN **OR** ondansetron  (ZOFRAN ) injection 4 mg, 4 mg, Intravenous, Q6H PRN, [name], [name], MD   oxyCODONE  (Oxy IR/ROXICODONE ) immediate release tablet 5-10 mg, 5-10 mg, Oral, Q4H PRN, [name], [name], MD, 10 mg at 12/30/23 9096   rosuvastatin  (CRESTOR ) tablet 40 mg, 40 mg, Oral, Daily, [name], [name], MD, 40 mg at 12/30/23 9096   traZODone  (DESYREL ) tablet 25 mg, 25 mg, Oral, QHS PRN, [name], [name],  MD, 25 mg at 12/29/23 2251    ALLERGIES   Metformin and Lisinopril      REVIEW OF SYSTEMS    Review of Systems:  Gen:  Denies  fever, sweats, chills weigh loss  HEENT: Denies blurred vision, double vision, ear pain, eye pain, hearing loss, nose bleeds, sore throat Cardiac:  No dizziness, chest pain or heaviness, chest tightness,edema Resp:   reports dyspnea chronically  Gi: Denies swallowing difficulty, stomach pain, nausea or vomiting, diarrhea, constipation, bowel incontinence Gu:  Denies bladder incontinence, burning urine Ext:   Denies Joint pain, stiffness or swelling Skin: Denies  skin rash, easy bruising or bleeding or hives Endoc:  Denies polyuria, polydipsia , polyphagia  or weight change Psych:   Denies depression, insomnia or hallucinations   Other:  All other systems negative   VS: BP (!) 140/61 (BP Location: Left Arm)   Pulse (!) 56   Temp (!) 97.4 F (36.3 C)   Resp 16   Ht 5' 7 (1.702 m)   Wt (!) 165.1 kg   SpO2 96%   BMI 57.01 kg/m      PHYSICAL EXAM    GENERAL:NAD, no fevers, chills, no weakness no fatigue HEAD: Normocephalic, atraumatic.  EYES: Pupils equal, round, reactive to light. Extraocular muscles intact. No scleral icterus.  MOUTH: Moist mucosal membrane. Dentition intact. No abscess noted.  EAR, NOSE, THROAT: Clear without exudates. No external lesions.  NECK: Supple. No thyromegaly. No nodules. No JVD.  PULMONARY: decreased breath sounds with mild rhonchi worse at bases bilaterally.  CARDIOVASCULAR: S1 and S2. Regular rate and rhythm. No murmurs, rubs, or gallops. No edema. Pedal pulses 2+ bilaterally.  GASTROINTESTINAL: Soft, nontender, nondistended. No masses. Positive bowel sounds. No hepatosplenomegaly.  MUSCULOSKELETAL: No swelling, clubbing, or edema. Range of motion full in all extremities.  NEUROLOGIC: Cranial nerves II through XII are intact. No gross focal neurological deficits. Sensation intact. Reflexes intact.  SKIN: No  ulceration, lesions, rashes, or cyanosis. Skin warm and dry. Turgor intact.  PSYCHIATRIC: Mood, affect within normal limits. The patient is awake, alert and oriented x 3. Insight, judgment intact.       IMAGING     Narrative & Impression  CLINICAL DATA:  Pleuritic chest pain.   EXAM: PORTABLE CHEST 1 VIEW   COMPARISON:  12/23/2023   FINDINGS: Lungs are hypoinflated without focal airspace consolidation or effusion. Cardiomediastinal silhouette and remainder of the exam is unchanged.   IMPRESSION: Hypoinflation without acute cardiopulmonary disease.     Electronically Signed   By: Toribio Agreste M.D.   On: 12/29/2023 11:35        ASSESSMENT/PLAN   Acute asthma exacerbation - severe      - RSV/COVID/FLU negative      - RVP ordered       - CRP ordered        - lasix  increased due to interstitial edem      - continue current solumedrol at 40 iv bid    Atelectasis     Chest physiotherapy TID with metaneb   OSA/OHS overlap    - ABG for BIPAP qualification              Thank you for allowing me to participate in the care of this patient.   Patient/Family are satisfied with care plan and all questions have been answered.    Provider disclosure: Patient with at least one acute or chronic illness or injury that poses a threat to life or bodily function and is being managed actively during this encounter.  All of the below services have been performed independently by signing provider:  review of prior documentation from internal and or external health records.  Review of previous and current lab results.  Interview and comprehensive assessment during patient visit today. Review of current and previous chest radiographs/CT scans. Discussion of management and test interpretation with health care team and patient/family.   This document was prepared using Dragon voice recognition software and may include unintentional dictation errors.     Lavalle Skoda,  M.D.  Division of Pulmonary & Critical Care Medicine

## 2023-12-30 NOTE — TOC Initial Note (Signed)
 Transition of Care [facility]) - Initial/Assessment Note    Patient Details  Name: Alexandra Macias MRN: [id number] Date of Birth: [birth year]  [facility]) CM/SW Contact:    Dalia GORMAN Fuse, RN Phone Number: 12/30/2023, 10:47 AM  Clinical Narrative:                  Patient is from home. TOC placed resources on AVS to address housing, food, utilities, and finances. Please outreach if additional TOC needs are identified.       Patient Goals and CMS Choice            Expected Discharge Plan and Services                                              Prior Living Arrangements/Services                       Activities of Daily Living   ADL Screening (condition at time of admission) Independently performs ADLs?: Yes (appropriate for developmental age) Is the patient deaf or have difficulty hearing?: No Does the patient have difficulty seeing, even when wearing glasses/contacts?: Yes Does the patient have difficulty concentrating, remembering, or making decisions?: No  Permission Sought/Granted                  Emotional Assessment              Admission diagnosis:  Acute respiratory failure with hypoxia (HCC) [J96.01] Asthma exacerbation [J45.901] Acute asthma exacerbation [J45.901] Exacerbation of persistent asthma, unspecified asthma severity [J45.901] Patient Active Problem List   Diagnosis Date Noted   Acute respiratory failure with hypoxia (HCC) 12/24/2023   Asthma exacerbation 12/23/2023   Acute asthma exacerbation 12/23/2023   C. difficile colitis 01/24/2023   Nausea vomiting and diarrhea 01/24/2023   Left adrenal mass 01/24/2023   Obesity, Class III, BMI 40-49.9 (morbid obesity) 01/24/2023   ESBL (extended spectrum beta-lactamase) producing bacteria infection 01/15/2023   Sepsis due to gram-negative UTI (HCC) 01/12/2023   Essential hypertension 01/12/2023   Dyslipidemia 01/12/2023   Type 2 diabetes mellitus with  peripheral neuropathy (HCC) 01/12/2023   History of DVT (deep vein thrombosis) 06/10/2020   PCP:  Cyrus Selinda Moose, PA-C Pharmacy:   [facility] [address]), [place] - [address] [place]) [place] [address] Phone: [phone] Fax: [phone]     Social Drivers of Health (SDOH) Social History: SDOH Screenings   Food Insecurity: Food Insecurity Present (12/24/2023)  Housing: High Risk (12/24/2023)  Transportation Needs: No Transportation Needs (12/24/2023)  Utilities: At Risk (12/24/2023)  Financial Resource Strain: High Risk (06/29/2023)   Received from [facility]  Physical Activity: Insufficiently Active (03/18/2023)   Received from [facility]  Social Connections: Moderately Integrated (03/18/2023)   Received from [facility]  Stress: No Stress Concern Present (03/18/2023)   Received from [facility]  Tobacco Use: Medium Risk (12/25/2023)  Health Literacy: Adequate Health Literacy (03/18/2023)   Received from [facility]   SDOH Interventions:     Readmission Risk Interventions     No data to display

## 2023-12-30 NOTE — Progress Notes (Signed)
 Mobility Specialist - Progress Note     12/30/23 1300  Mobility  Activity Ambulated independently  Level of Assistance Independent after set-up  Assistive Device Front wheel walker  Distance Ambulated (ft) 640 ft  Range of Motion/Exercises Active  Activity Response Tolerated well  Mobility Referral Yes  Mobility visit 1 Mobility  Mobility Specialist Start Time (ACUTE ONLY) 1257  Mobility Specialist Stop Time (ACUTE ONLY) 1317  Mobility Specialist Time Calculation (min) (ACUTE ONLY) 20 min   Pt resting in recliner on RA upon entry. Pt STS and ambulates to hallway around NS for 4 laps Indep with RW. Pt endorses SOB but motivated to participate in ambulation. Pt returned to recliner and left with needs in reach.   Guido Rumble Mobility Specialist 12/30/23, 1:23 PM

## 2023-12-30 NOTE — Progress Notes (Signed)
 Mobility Specialist - Progress Note     12/30/23 1614  Oxygen Therapy  O2 Device Room Air  Mobility  Activity Ambulated independently  Level of Assistance Modified independent, requires aide device or extra time  Assistive Device Front wheel walker  Distance Ambulated (ft) 600 ft  Activity Response Tolerated well  Mobility Referral Yes  Mobility visit 1 Mobility  Mobility Specialist Start Time (ACUTE ONLY) 1536  Mobility Specialist Stop Time (ACUTE ONLY) 1615  Mobility Specialist Time Calculation (min) (ACUTE ONLY) 39 min   Pt was standing upright upon entry. Pt agreed to mobility. Pt ambulated well. After activity pt returned to room. Needs were in reach and nurse entered room.  Clem Rodes Mobility Specialist 12/30/23, 4:24 PM

## 2023-12-30 NOTE — Plan of Care (Signed)
  Problem: Metabolic: Goal: Ability to maintain appropriate glucose levels will improve 12/30/2023 1057 by Kateri Caprice HERO, RN Outcome: Progressing 12/30/2023 1056 by Kateri Caprice HERO, RN Outcome: Progressing   Problem: Education: Goal: Ability to describe self-care measures that may prevent or decrease complications (Diabetes Survival Skills Education) will improve 12/30/2023 1057 by Kateri Caprice HERO, RN Outcome: Progressing 12/30/2023 1056 by Kateri Caprice HERO, RN Outcome: Progressing   Problem: Coping: Goal: Ability to adjust to condition or change in health will improve 12/30/2023 1057 by Kateri Caprice HERO, RN Outcome: Progressing 12/30/2023 1056 by Kateri Caprice HERO, RN Outcome: Progressing   Problem: Fluid Volume: Goal: Ability to maintain a balanced intake and output will improve 12/30/2023 1057 by Kateri Caprice HERO, RN Outcome: Progressing 12/30/2023 1057 by Kateri Caprice HERO, RN Outcome: Progressing 12/30/2023 1056 by Kateri Caprice HERO, RN Outcome: Progressing   Problem: Health Behavior/Discharge Planning: Goal: Ability to identify and utilize available resources and services will improve 12/30/2023 1057 by Kateri Caprice HERO, RN Outcome: Progressing 12/30/2023 1056 by Kateri Caprice HERO, RN Outcome: Progressing   Problem: Metabolic: Goal: Ability to maintain appropriate glucose levels will improve 12/30/2023 1057 by Kateri Caprice HERO, RN Outcome: Progressing 12/30/2023 1056 by Kateri Caprice HERO, RN Outcome: Progressing   Problem: Nutritional: Goal: Maintenance of adequate nutrition will improve Outcome: Progressing Goal: Progress toward achieving an optimal weight will improve Outcome: Progressing   Problem: Skin Integrity: Goal: Risk for impaired skin integrity will decrease 12/30/2023 1057 by Kateri Caprice HERO, RN Outcome: Progressing 12/30/2023 1056 by Kateri Caprice HERO, RN Outcome: Progressing   Problem: Tissue Perfusion: Goal: Adequacy of tissue perfusion will improve 12/30/2023 1057 by Kateri Caprice HERO, RN Outcome: Progressing 12/30/2023 1056 by Kateri Caprice HERO, RN Outcome: Progressing   Problem: Education: Goal: Knowledge of General Education information will improve Description: Including pain rating scale, medication(s)/side effects and non-pharmacologic comfort measures Outcome: Progressing   Problem: Health Behavior/Discharge Planning: Goal: Ability to manage health-related needs will improve Outcome: Progressing   Problem: Clinical Measurements: Goal: Ability to maintain clinical measurements within normal limits will improve Outcome: Progressing

## 2023-12-30 NOTE — Plan of Care (Signed)
  Problem: Education: Goal: Ability to describe self-care measures that may prevent or decrease complications (Diabetes Survival Skills Education) will improve Outcome: Progressing   Problem: Coping: Goal: Ability to adjust to condition or change in health will improve Outcome: Progressing   Problem: Fluid Volume: Goal: Ability to maintain a balanced intake and output will improve Outcome: Progressing   Problem: Health Behavior/Discharge Planning: Goal: Ability to identify and utilize available resources and services will improve Outcome: Progressing   Problem: Metabolic: Goal: Ability to maintain appropriate glucose levels will improve Outcome: Progressing   Problem: Nutritional: Goal: Maintenance of adequate nutrition will improve Outcome: Progressing Goal: Progress toward achieving an optimal weight will improve Outcome: Progressing   Problem: Skin Integrity: Goal: Risk for impaired skin integrity will decrease Outcome: Progressing   Problem: Tissue Perfusion: Goal: Adequacy of tissue perfusion will improve Outcome: Progressing   Problem: Education: Goal: Knowledge of General Education information will improve Description: Including pain rating scale, medication(s)/side effects and non-pharmacologic comfort measures Outcome: Progressing   Problem: Health Behavior/Discharge Planning: Goal: Ability to manage health-related needs will improve Outcome: Progressing   Problem: Clinical Measurements: Goal: Ability to maintain clinical measurements within normal limits will improve Outcome: Progressing

## 2023-12-30 NOTE — Progress Notes (Signed)
 PROGRESS NOTE    Alexandra Macias  FMW:[id number] DOB: 1961/04/01 DOA: 12/23/2023 PCP: Cyrus Selinda Moose, PA-C    Brief Narrative:   This 63 yrs old female with PMH significant for asthma, type 2 diabetes, hypertension, dyslipidemia and migraine presented in the ED with acute onset of shortness of breath associated with productive cough of yellowish sputum as well as wheezing for last few days. She also reports having some chest pain with coughing.She denies any fever but reports having significant chills.  In the ED She was hypoxic requiring 2 L of supplemental oxygen.  BNP 351.9, troponin 7.0, respiratory viral panel negative.  Chest x-ray shows no acute cardiopulmonary disease.  Patient was significantly wheezing.  Patient was given IV Solu-Medrol ,  IV magnesium , nebulized bronchodilators and patient was admitted for asthma exacerbation.   Assessment & Plan:   Principal Problem:   Asthma exacerbation Active Problems:   Dyslipidemia   Acute respiratory failure with hypoxia (HCC)   Essential hypertension   Type 2 diabetes mellitus with peripheral neuropathy (HCC)   Obesity, Class III, BMI 40-49.9 (morbid obesity)   Acute asthma exacerbation   History of DVT (deep vein thrombosis)  Acute hypoxic respiratory failure: Asthma exacerbation secondary to rhinovirus Patient presented with productive cough associated with significant shortness of breath and wheezing.  Home inhalers ineffective.  Still wheezing. CTA no PE or pneumonia. Rhinovirus positive Plan: MetaNeb every 4 hours Solu-Medrol  40 mg IV  DuoNebs every 4 hours Twice daily Brovana  Twice daily Pulmicort  Wean oxygen as tolerated Antitussives and mucolytic's Pulm now advising  Chest pain New yesterday, with coughing, tender at sternum, suspect costochonidritis. Trops neg, dimer pos but cta neg for pe or other pathology. Pain much improve - consider nsaid for costochondritis if significant symptoms  return  Dyslipidemia: PTA Crestor    Essential hypertension: PTA hydrochlorothiazide    Type 2 diabetes mellitus with peripheral neuropathy (HCC) Resumed Amaryl  and Neurontin  - daily fasting sugar   Morbid obesity: Complicating factor in overall care and prognosis    DVT prophylaxis: Lovenox  Code Status: Full Family Communication: None Disposition Plan: Status is: Inpatient Remains inpatient appropriate because: Severe asthma exacerbation   Level of care: Telemetry Medical  Consultants:  None  Procedures:  None  Antimicrobials: None   Subjective: Seen and examined.  Reports ongoing dyspnea and wheeze, mild improvement from yesterday  Objective: Vitals:   12/29/23 2123 12/30/23 0430 12/30/23 0725 12/30/23 0752  BP: 134/72 (!) 154/72  (!) 140/61  Pulse: 71 65  (!) 56  Resp: 18 20  16   Temp: 98.9 F (37.2 C) 98.5 F (36.9 C)  (!) 97.4 F (36.3 C)  TempSrc: Oral Oral    SpO2: 97% 94% 97% 96%  Weight:      Height:        Intake/Output Summary (Last 24 hours) at 12/30/2023 1425 Last data filed at 12/30/2023 1015 Gross per 24 hour  Intake 240 ml  Output --  Net 240 ml   Filed Weights   12/23/23 1841  Weight: (!) 165.1 kg    Examination:  General exam: Appears uncomfortable Respiratory system: exp wheeze throughout Cardiovascular system: S1-S2, RRR, no murmurs, ttp around sternum Gastrointestinal system: Obese, soft, NT/ND, normal bowel sounds Central nervous system: Alert and oriented. No focal neurological deficits. Extremities: Symmetric 5 x 5 power. Skin: No rashes, lesions or ulcers Psychiatry: Judgement and insight appear normal. Mood & affect appropriate.     Data Reviewed: I have personally reviewed following labs and imaging studies  CBC: Recent Labs  Lab 12/23/23 1918 12/24/23 0424 12/26/23 0309  WBC 8.7 9.9 12.5*  NEUTROABS 5.6  --   --   HGB 13.6 12.3 12.1  HCT 43.4 38.5 38.1  MCV 83.8 81.7 82.3  PLT 282 283 291   Basic  Metabolic Panel: Recent Labs  Lab 12/23/23 1918 12/24/23 0424 12/26/23 0309 12/29/23 1039  NA 141 139 139 134*  K 3.6 4.1 4.5 3.8  CL 103 104 101 92*  CO2 26 25 29 27   GLUCOSE 132* 185* 107* 213*  BUN 14 12 18 23   CREATININE 0.69 0.54 0.68 0.98  CALCIUM  9.0 8.9 9.1 9.0  MG  --   --  2.0  --   PHOS  --   --  2.5  --    GFR: Estimated Creatinine Clearance: 96.8 mL/min (by C-G formula based on SCr of 0.98 mg/dL). Liver Function Tests: No results for input(s): AST, ALT, ALKPHOS, BILITOT, PROT, ALBUMIN in the last 168 hours. No results for input(s): LIPASE, AMYLASE in the last 168 hours. No results for input(s): AMMONIA in the last 168 hours. Coagulation Profile: No results for input(s): INR, PROTIME in the last 168 hours. Cardiac Enzymes: No results for input(s): CKTOTAL, CKMB, CKMBINDEX, TROPONINI in the last 168 hours. BNP (last 3 results) No results for input(s): PROBNP in the last 8760 hours. HbA1C: No results for input(s): HGBA1C in the last 72 hours.  CBG: Recent Labs  Lab 12/29/23 0730 12/29/23 1207 12/29/23 1714 12/29/23 2124 12/30/23 0753  GLUCAP 113* 180* 128* 130* 182*   Lipid Profile: No results for input(s): CHOL, HDL, LDLCALC, TRIG, CHOLHDL, LDLDIRECT in the last 72 hours. Thyroid Function Tests: No results for input(s): TSH, T4TOTAL, FREET4, T3FREE, THYROIDAB in the last 72 hours. Anemia Panel: No results for input(s): VITAMINB12, FOLATE, FERRITIN, TIBC, IRON, RETICCTPCT in the last 72 hours. Sepsis Labs: No results for input(s): PROCALCITON, LATICACIDVEN in the last 168 hours.  Recent Results (from the past 240 hours)  Resp panel by RT-PCR (RSV, Flu A&B, Covid) Anterior Nasal Swab     Status: None   Collection Time: 12/23/23  6:46 PM   Specimen: Anterior Nasal Swab  Result Value Ref Range Status   SARS Coronavirus 2 by RT PCR NEGATIVE NEGATIVE Final    Comment:  (NOTE) SARS-CoV-2 target nucleic acids are NOT DETECTED.  The SARS-CoV-2 RNA is generally detectable in upper respiratory specimens during the acute phase of infection. The lowest concentration of SARS-CoV-2 viral copies this assay can detect is 138 copies/mL. A negative result does not preclude SARS-Cov-2 infection and should not be used as the sole basis for treatment or other patient management decisions. A negative result may occur with  improper specimen collection/handling, submission of specimen other than nasopharyngeal swab, presence of viral mutation(s) within the areas targeted by this assay, and inadequate number of viral copies(<138 copies/mL). A negative result must be combined with clinical observations, patient history, and epidemiological information. The expected result is Negative.  Fact Sheet for Patients:  BloggerCourse.com  Fact Sheet for Healthcare Providers:  SeriousBroker.it  This test is no t yet approved or cleared by the [place]  FDA and  has been authorized for detection and/or diagnosis of SARS-CoV-2 by FDA under an Emergency Use Authorization (EUA). This EUA will remain  in effect (meaning this test can be used) for the duration of the COVID-19 declaration under Section 564(b)(1) of the Act, 21 U.S.C.section 360bbb-3(b)(1), unless the authorization is terminated  or revoked sooner.  Influenza A by PCR NEGATIVE NEGATIVE Final   Influenza B by PCR NEGATIVE NEGATIVE Final    Comment: (NOTE) The Xpert Xpress SARS-CoV-2/FLU/RSV plus assay is intended as an aid in the diagnosis of influenza from Nasopharyngeal swab specimens and should not be used as a sole basis for treatment. Nasal washings and aspirates are unacceptable for Xpert Xpress SARS-CoV-2/FLU/RSV testing.  Fact Sheet for Patients: BloggerCourse.com  Fact Sheet for Healthcare  Providers: SeriousBroker.it  This test is not yet approved or cleared by the [place]  FDA and has been authorized for detection and/or diagnosis of SARS-CoV-2 by FDA under an Emergency Use Authorization (EUA). This EUA will remain in effect (meaning this test can be used) for the duration of the COVID-19 declaration under Section 564(b)(1) of the Act, 21 U.S.C. section 360bbb-3(b)(1), unless the authorization is terminated or revoked.     Resp Syncytial Virus by PCR NEGATIVE NEGATIVE Final    Comment: (NOTE) Fact Sheet for Patients: BloggerCourse.com  Fact Sheet for Healthcare Providers: SeriousBroker.it  This test is not yet approved or cleared by the [place]  FDA and has been authorized for detection and/or diagnosis of SARS-CoV-2 by FDA under an Emergency Use Authorization (EUA). This EUA will remain in effect (meaning this test can be used) for the duration of the COVID-19 declaration under Section 564(b)(1) of the Act, 21 U.S.C. section 360bbb-3(b)(1), unless the authorization is terminated or revoked.  Performed at [facility], [address] [place], [place] [address]   Respiratory (~20 pathogens) panel by PCR     Status: Abnormal   Collection Time: 12/29/23  1:00 PM   Specimen: Nasopharyngeal Swab; Respiratory  Result Value Ref Range Status   Adenovirus NOT DETECTED NOT DETECTED Final   Coronavirus 229E NOT DETECTED NOT DETECTED Final    Comment: (NOTE) The Coronavirus on the Respiratory Panel, DOES NOT test for the novel  Coronavirus (2019 nCoV)    Coronavirus HKU1 NOT DETECTED NOT DETECTED Final   Coronavirus NL63 NOT DETECTED NOT DETECTED Final   Coronavirus OC43 NOT DETECTED NOT DETECTED Final   Metapneumovirus NOT DETECTED NOT DETECTED Final   Rhinovirus / Enterovirus DETECTED (A) NOT DETECTED Final   Influenza A NOT DETECTED NOT DETECTED Final   Influenza B NOT  DETECTED NOT DETECTED Final   Parainfluenza Virus 1 NOT DETECTED NOT DETECTED Final   Parainfluenza Virus 2 NOT DETECTED NOT DETECTED Final   Parainfluenza Virus 3 NOT DETECTED NOT DETECTED Final   Parainfluenza Virus 4 NOT DETECTED NOT DETECTED Final   Respiratory Syncytial Virus NOT DETECTED NOT DETECTED Final   Bordetella pertussis NOT DETECTED NOT DETECTED Final   Bordetella Parapertussis NOT DETECTED NOT DETECTED Final   Chlamydophila pneumoniae NOT DETECTED NOT DETECTED Final   Mycoplasma pneumoniae NOT DETECTED NOT DETECTED Final    Comment: Performed at [facility], [address], [place], [place] [address]         Radiology Studies: CT Angio Chest Pulmonary Embolism (PE) W or WO Contrast Result Date: 12/29/2023 CLINICAL DATA:  Shortness of breath, chest pain, elevated D-dimer EXAM: CT ANGIOGRAPHY CHEST WITH CONTRAST TECHNIQUE: Multidetector CT imaging of the chest was performed using the standard protocol during bolus administration of intravenous contrast. Multiplanar CT image reconstructions and MIPs were obtained to evaluate the vascular anatomy. RADIATION DOSE REDUCTION: This exam was performed according to the departmental dose-optimization program which includes automated exposure control, adjustment of the mA and/or kV according to patient size and/or use of iterative reconstruction technique.  CONTRAST:  75mL OMNIPAQUE  IOHEXOL  350 MG/ML SOLN COMPARISON:  None Available. FINDINGS: Cardiovascular: No filling defects in the pulmonary arteries to suggest pulmonary emboli. Heart is normal size. Aorta is normal caliber. Mediastinum/Nodes: No mediastinal, hilar, or axillary adenopathy. Trachea and esophagus are unremarkable. Thyroid unremarkable. Lungs/Pleura: No confluent opacities or effusions. Linear subsegmental atelectasis or scarring in the lung bases. Upper Abdomen: No acute findings Musculoskeletal: Chest wall soft tissues are unremarkable. No acute bony abnormality.  Review of the MIP images confirms the above findings. IMPRESSION: No evidence of pulmonary embolus. No acute cardiopulmonary disease. Electronically Signed   By: Franky Crease M.D.   On: 12/29/2023 17:24   DG Chest Port 1 View Result Date: 12/29/2023 CLINICAL DATA:  Pleuritic chest pain. EXAM: PORTABLE CHEST 1 VIEW COMPARISON:  12/23/2023 FINDINGS: Lungs are hypoinflated without focal airspace consolidation or effusion. Cardiomediastinal silhouette and remainder of the exam is unchanged. IMPRESSION: Hypoinflation without acute cardiopulmonary disease. Electronically Signed   By: Toribio Agreste M.D.   On: 12/29/2023 11:35        Scheduled Meds:  arformoterol   15 mcg Nebulization BID   baclofen   10 mg Oral QHS   benzonatate   200 mg Oral TID   budesonide  (PULMICORT ) nebulizer solution  0.25 mg Nebulization BID   chlorpheniramine-HYDROcodone   5 mL Oral QHS   enoxaparin  (LOVENOX ) injection  85 mg Subcutaneous Q24H   feeding supplement (GLUCERNA SHAKE)  237 mL Oral TID BM   furosemide   40 mg Intravenous Daily   gabapentin   300 mg Oral TID   glimepiride   1 mg Oral Q breakfast   guaiFENesin   600 mg Oral BID   hydrochlorothiazide   25 mg Oral Daily   insulin  aspart  0-20 Units Subcutaneous TID WC   insulin  aspart  0-5 Units Subcutaneous QHS   ipratropium-albuterol   3 mL Nebulization QID   [START ON 12/31/2023] methylPREDNISolone  (SOLU-MEDROL ) injection  40 mg Intravenous Q24H   rosuvastatin   40 mg Oral Daily   Continuous Infusions:   LOS: 6 days       Devaughn KATHEE Ban, MD [facility]   If 7PM-7AM, please contact night-coverage  12/30/2023, 2:25 PM

## 2023-12-30 NOTE — Progress Notes (Signed)
 PT Cancellation Note  Patient Details Name: Alexandra Macias MRN: [id number] DOB: 1961/01/18   Cancelled Treatment:    Reason Eval/Treat Not Completed: PT screened, no needs identified, will sign off Pt was met sitting in recliner, per mobility specialist pt was able to amb ~661ft with RW. Pt reported she has no specific PT concerns and feels she is at her baseline. Education porovided on household use of RW for energy conservation and to maintain mobility status during admission. PT to sign off at this time.    Roiza Wiedel, SPT

## 2023-12-31 ENCOUNTER — Other Ambulatory Visit: Payer: Self-pay

## 2023-12-31 ENCOUNTER — Telehealth (HOSPITAL_COMMUNITY): Payer: Self-pay | Admitting: Pharmacy Technician

## 2023-12-31 ENCOUNTER — Other Ambulatory Visit (HOSPITAL_COMMUNITY): Payer: Self-pay

## 2023-12-31 DIAGNOSIS — J45901 Unspecified asthma with (acute) exacerbation: Secondary | ICD-10-CM | POA: Diagnosis not present

## 2023-12-31 LAB — GLUCOSE, CAPILLARY: Glucose-Capillary: 114 mg/dL — ABNORMAL HIGH (ref 70–99)

## 2023-12-31 MED ORDER — PREDNISONE 20 MG PO TABS: 30.0000 mg | ORAL_TABLET | Freq: Every day | ORAL | Status: DC

## 2023-12-31 MED ORDER — PREDNISONE 10 MG PO TABS
ORAL_TABLET | ORAL | 0 refills | Status: AC
  Filled 2023-12-31: qty 30, 10d supply, fill #0

## 2023-12-31 MED ORDER — PREDNISONE 50 MG PO TABS: 25.0000 mg | ORAL_TABLET | Freq: Every day | ORAL | Status: DC

## 2023-12-31 MED ORDER — PREDNISONE 50 MG PO TABS: 35.0000 mg | ORAL_TABLET | Freq: Every day | ORAL | Status: DC

## 2023-12-31 MED ORDER — PREDNISONE 50 MG PO TABS: 45.0000 mg | ORAL_TABLET | Freq: Every day | ORAL | Status: DC

## 2023-12-31 MED ORDER — PREDNISONE 10 MG PO TABS: 10.0000 mg | ORAL_TABLET | Freq: Every day | ORAL | Status: DC

## 2023-12-31 MED ORDER — PREDNISONE 10 MG PO TABS: ORAL_TABLET | ORAL | 0 refills | Status: DC

## 2023-12-31 MED ORDER — PREDNISONE 20 MG PO TABS: 40.0000 mg | ORAL_TABLET | Freq: Every day | ORAL | Status: DC

## 2023-12-31 MED ORDER — PREDNISONE 50 MG PO TABS: 50.0000 mg | ORAL_TABLET | Freq: Every day | ORAL | Status: DC

## 2023-12-31 MED ORDER — PREDNISONE 10 MG PO TABS: 15.0000 mg | ORAL_TABLET | Freq: Every day | ORAL | Status: DC

## 2023-12-31 MED ORDER — PREDNISONE 50 MG PO TABS
50.0000 mg | ORAL_TABLET | Freq: Every day | ORAL | Status: DC
Start: 2024-01-01 — End: 2023-12-31

## 2023-12-31 MED ORDER — PREDNISONE 20 MG PO TABS: 20.0000 mg | ORAL_TABLET | Freq: Every day | ORAL | Status: DC

## 2023-12-31 MED ORDER — PREDNISONE 10 MG PO TABS: 5.0000 mg | ORAL_TABLET | Freq: Every day | ORAL | Status: DC

## 2023-12-31 NOTE — Discharge Summary (Addendum)
 Alexandra Macias FMW:[id number] DOB: 1961-03-20 DOA: 12/23/2023  PCP: Cyrus Selinda Moose, PA-C  Admit date: 12/23/2023 Discharge date: 12/31/2023  Time spent: 35 minutes  Recommendations for Outpatient Follow-up:  Pcp f/u     Discharge Diagnoses:  Principal Problem:   Asthma exacerbation Active Problems:   Dyslipidemia   Acute respiratory failure with hypoxia (HCC)   Essential hypertension   Type 2 diabetes mellitus with peripheral neuropathy (HCC)   Obesity, Class III, BMI 40-49.9 (morbid obesity)   Acute asthma exacerbation   History of DVT (deep vein thrombosis)   Discharge Condition: stable  Diet recommendation: carb modified heart healthy  Filed Weights   12/23/23 1841  Weight: (!) 165.1 kg    History of present illness:  From admission h and p Alexandra Macias is a 63 y.o. female with medical history significant for asthma, type II diabetes mellitus, hypertension, dyslipidemia and migraine, who presented to the emergency room with acute onset of worsening dyspnea with associated cough productive of yellowish sputum as well as wheezing for the last couple of days.  She has been having chest pain with cough.  She admitted to chills earlier without measured fever.  No nausea or vomiting or abdominal pain.  No dysuria, oliguria or hematuria or flank pain.    Hospital Course:   Patient presents with wheeze and dyspnea, found to have acute asthma exacerbation secondary to rhinovirus. CTA nothing acute. Treated with breathing treatments and IV steroids. Pulmonology assisted with care. Steady improvement, weaned off oxygen, now transitioned to oral steroids, ambulating independently no home health needs identified by PT, was kept an additional night given patient's hesitance about discharge, now remains stable for discharge per pulmonology, will discharge with steroid taper, continue albuterol , advise close pcp f/u. Patient also made aware of her right to appeal discharge. Asked  pharmacist to review costs for controller corticosteroid for this patient but unfortunately copays would be about $200, patient thus declines a controller med for now.  Procedures: none   Consultations: pulmonology  Discharge Exam: Vitals:   12/31/23 0759 12/31/23 1000  BP: 128/77   Pulse: 66   Resp: 20   Temp: 97.9 F (36.6 C)   SpO2: 95% 96%    General: NAD Cardiovascular: RRR Respiratory: exp wheeze, speaking complete sentences, no use of accessory muscles, no tachypnea  Discharge Instructions   Discharge Instructions     Diet - low sodium heart healthy   Complete by: As directed    Increase activity slowly   Complete by: As directed       Allergies as of 12/31/2023       Reactions   Metformin Diarrhea   Lisinopril  Cough        Medication List     STOP taking these medications    amLODipine  10 MG tablet Commonly known as: NORVASC    lisinopril  5 MG tablet Commonly known as: ZESTRIL        TAKE these medications    albuterol  108 (90 Base) MCG/ACT inhaler Commonly known as: VENTOLIN  HFA Inhale 2 puffs into the lungs every 4 (four) hours as needed for wheezing or shortness of breath.   baclofen  10 MG tablet Commonly known as: LIORESAL  Take 10 mg by mouth at bedtime.   furosemide  20 MG tablet Commonly known as: LASIX  Take 20 mg by mouth.   gabapentin  300 MG capsule Commonly known as: NEURONTIN  Take 1 capsule (300 mg total) by mouth 3 (three) times daily. Home med.   glimepiride  1 MG tablet  Commonly known as: AMARYL  Take 1 mg by mouth every morning.   hydrochlorothiazide  25 MG tablet Commonly known as: HYDRODIURIL  Take 25 mg by mouth daily.   montelukast  10 MG tablet Commonly known as: SINGULAIR  Take 10 mg by mouth at bedtime.   ondansetron  4 MG tablet Commonly known as: Zofran  Take 1 tablet (4 mg total) by mouth daily as needed for nausea or vomiting.   predniSONE  10 MG tablet Commonly known as: DELTASONE  Take 5 tablets (50 mg  total) by mouth daily for 2 days, THEN 4 tablets (40 mg total) daily for 2 days, THEN 2 tablets (20 mg total) daily for 2 days, THEN 1 tablet (10 mg total) daily for 2 days. Start taking on: December 31, 2023   rosuvastatin  40 MG tablet Commonly known as: CRESTOR  Take 40 mg by mouth daily.   Rybelsus  3 MG Tabs Generic drug: Semaglutide  Take 3 mg by mouth daily.       Allergies  Allergen Reactions   Metformin Diarrhea   Lisinopril  Cough    Follow-up Information     Cyrus Mayo Hestle, PA-C Follow up.   Specialty: Family Medicine Why: hospital follow up Contact information: [address] [place] [address] [phone]                  The results of significant diagnostics from this hospitalization (including imaging, microbiology, ancillary and laboratory) are listed below for reference.    Significant Diagnostic Studies: CT Angio Chest Pulmonary Embolism (PE) W or WO Contrast Result Date: 12/29/2023 CLINICAL DATA:  Shortness of breath, chest pain, elevated D-dimer EXAM: CT ANGIOGRAPHY CHEST WITH CONTRAST TECHNIQUE: Multidetector CT imaging of the chest was performed using the standard protocol during bolus administration of intravenous contrast. Multiplanar CT image reconstructions and MIPs were obtained to evaluate the vascular anatomy. RADIATION DOSE REDUCTION: This exam was performed according to the departmental dose-optimization program which includes automated exposure control, adjustment of the mA and/or kV according to patient size and/or use of iterative reconstruction technique. CONTRAST:  75mL OMNIPAQUE  IOHEXOL  350 MG/ML SOLN COMPARISON:  None Available. FINDINGS: Cardiovascular: No filling defects in the pulmonary arteries to suggest pulmonary emboli. Heart is normal size. Aorta is normal caliber. Mediastinum/Nodes: No mediastinal, hilar, or axillary adenopathy. Trachea and esophagus are unremarkable. Thyroid unremarkable. Lungs/Pleura: No  confluent opacities or effusions. Linear subsegmental atelectasis or scarring in the lung bases. Upper Abdomen: No acute findings Musculoskeletal: Chest wall soft tissues are unremarkable. No acute bony abnormality. Review of the MIP images confirms the above findings. IMPRESSION: No evidence of pulmonary embolus. No acute cardiopulmonary disease. Electronically Signed   By: Franky Crease M.D.   On: 12/29/2023 17:24   DG Chest Port 1 View Result Date: 12/29/2023 CLINICAL DATA:  Pleuritic chest pain. EXAM: PORTABLE CHEST 1 VIEW COMPARISON:  12/23/2023 FINDINGS: Lungs are hypoinflated without focal airspace consolidation or effusion. Cardiomediastinal silhouette and remainder of the exam is unchanged. IMPRESSION: Hypoinflation without acute cardiopulmonary disease. Electronically Signed   By: Toribio Agreste M.D.   On: 12/29/2023 11:35   DG Chest Portable 1 View Result Date: 12/23/2023 CLINICAL DATA:  Shortness of breath. EXAM: PORTABLE CHEST 1 VIEW COMPARISON:  Chest radiograph dated 01/12/2023. FINDINGS: No focal consolidation, pleural effusion or pneumothorax. The cardiac silhouette is within normal limits. No acute osseous pathology. IMPRESSION: No active disease. Electronically Signed   By: Vanetta Chou M.D.   On: 12/23/2023 19:13    Microbiology: Recent Results (from the past 240 hours)  Resp  panel by RT-PCR (RSV, Flu A&B, Covid) Anterior Nasal Swab     Status: None   Collection Time: 12/23/23  6:46 PM   Specimen: Anterior Nasal Swab  Result Value Ref Range Status   SARS Coronavirus 2 by RT PCR NEGATIVE NEGATIVE Final    Comment: (NOTE) SARS-CoV-2 target nucleic acids are NOT DETECTED.  The SARS-CoV-2 RNA is generally detectable in upper respiratory specimens during the acute phase of infection. The lowest concentration of SARS-CoV-2 viral copies this assay can detect is 138 copies/mL. A negative result does not preclude SARS-Cov-2 infection and should not be used as the sole basis for  treatment or other patient management decisions. A negative result may occur with  improper specimen collection/handling, submission of specimen other than nasopharyngeal swab, presence of viral mutation(s) within the areas targeted by this assay, and inadequate number of viral copies(<138 copies/mL). A negative result must be combined with clinical observations, patient history, and epidemiological information. The expected result is Negative.  Fact Sheet for Patients:  BloggerCourse.com  Fact Sheet for Healthcare Providers:  SeriousBroker.it  This test is no t yet approved or cleared by the [place]  FDA and  has been authorized for detection and/or diagnosis of SARS-CoV-2 by FDA under an Emergency Use Authorization (EUA). This EUA will remain  in effect (meaning this test can be used) for the duration of the COVID-19 declaration under Section 564(b)(1) of the Act, 21 U.S.C.section 360bbb-3(b)(1), unless the authorization is terminated  or revoked sooner.       Influenza A by PCR NEGATIVE NEGATIVE Final   Influenza B by PCR NEGATIVE NEGATIVE Final    Comment: (NOTE) The Xpert Xpress SARS-CoV-2/FLU/RSV plus assay is intended as an aid in the diagnosis of influenza from Nasopharyngeal swab specimens and should not be used as a sole basis for treatment. Nasal washings and aspirates are unacceptable for Xpert Xpress SARS-CoV-2/FLU/RSV testing.  Fact Sheet for Patients: BloggerCourse.com  Fact Sheet for Healthcare Providers: SeriousBroker.it  This test is not yet approved or cleared by the [place]  FDA and has been authorized for detection and/or diagnosis of SARS-CoV-2 by FDA under an Emergency Use Authorization (EUA). This EUA will remain in effect (meaning this test can be used) for the duration of the COVID-19 declaration under Section 564(b)(1) of the Act, 21  U.S.C. section 360bbb-3(b)(1), unless the authorization is terminated or revoked.     Resp Syncytial Virus by PCR NEGATIVE NEGATIVE Final    Comment: (NOTE) Fact Sheet for Patients: BloggerCourse.com  Fact Sheet for Healthcare Providers: SeriousBroker.it  This test is not yet approved or cleared by the [place]  FDA and has been authorized for detection and/or diagnosis of SARS-CoV-2 by FDA under an Emergency Use Authorization (EUA). This EUA will remain in effect (meaning this test can be used) for the duration of the COVID-19 declaration under Section 564(b)(1) of the Act, 21 U.S.C. section 360bbb-3(b)(1), unless the authorization is terminated or revoked.  Performed at [facility], [address] [place], [place] [address]   Respiratory (~20 pathogens) panel by PCR     Status: Abnormal   Collection Time: 12/29/23  1:00 PM   Specimen: Nasopharyngeal Swab; Respiratory  Result Value Ref Range Status   Adenovirus NOT DETECTED NOT DETECTED Final   Coronavirus 229E NOT DETECTED NOT DETECTED Final    Comment: (NOTE) The Coronavirus on the Respiratory Panel, DOES NOT test for the novel  Coronavirus (2019 nCoV)    Coronavirus HKU1 NOT DETECTED NOT DETECTED Final  Coronavirus NL63 NOT DETECTED NOT DETECTED Final   Coronavirus OC43 NOT DETECTED NOT DETECTED Final   Metapneumovirus NOT DETECTED NOT DETECTED Final   Rhinovirus / Enterovirus DETECTED (A) NOT DETECTED Final   Influenza A NOT DETECTED NOT DETECTED Final   Influenza B NOT DETECTED NOT DETECTED Final   Parainfluenza Virus 1 NOT DETECTED NOT DETECTED Final   Parainfluenza Virus 2 NOT DETECTED NOT DETECTED Final   Parainfluenza Virus 3 NOT DETECTED NOT DETECTED Final   Parainfluenza Virus 4 NOT DETECTED NOT DETECTED Final   Respiratory Syncytial Virus NOT DETECTED NOT DETECTED Final   Bordetella pertussis NOT DETECTED NOT DETECTED Final   Bordetella  Parapertussis NOT DETECTED NOT DETECTED Final   Chlamydophila pneumoniae NOT DETECTED NOT DETECTED Final   Mycoplasma pneumoniae NOT DETECTED NOT DETECTED Final    Comment: Performed at [facility], [address] [place], [place] [address]     Labs: Basic Metabolic Panel: Recent Labs  Lab 12/26/23 0309 12/29/23 1039  NA 139 134*  K 4.5 3.8  CL 101 92*  CO2 29 27  GLUCOSE 107* 213*  BUN 18 23  CREATININE 0.68 0.98  CALCIUM  9.1 9.0  MG 2.0  --   PHOS 2.5  --    Liver Function Tests: No results for input(s): AST, ALT, ALKPHOS, BILITOT, PROT, ALBUMIN in the last 168 hours. No results for input(s): LIPASE, AMYLASE in the last 168 hours. No results for input(s): AMMONIA in the last 168 hours. CBC: Recent Labs  Lab 12/26/23 0309  WBC 12.5*  HGB 12.1  HCT 38.1  MCV 82.3  PLT 291   Cardiac Enzymes: No results for input(s): CKTOTAL, CKMB, CKMBINDEX, TROPONINI in the last 168 hours. BNP: BNP (last 3 results) Recent Labs    12/23/23 1918 12/29/23 1039  BNP 351.9* 26.5    ProBNP (last 3 results) No results for input(s): PROBNP in the last 8760 hours.  CBG: Recent Labs  Lab 12/29/23 2124 12/30/23 0753 12/30/23 1704 12/30/23 2028 12/31/23 0757  GLUCAP 130* 182* 210* 131* 114*       Signed:  Devaughn KATHEE Ban MD.  [facility] 12/31/2023, 10:31 AM

## 2023-12-31 NOTE — Plan of Care (Signed)
  Problem: Health Behavior/Discharge Planning: Goal: Ability to manage health-related needs will improve Outcome: Progressing   Problem: Clinical Measurements: Goal: Respiratory complications will improve Outcome: Progressing   Problem: Activity: Goal: Risk for activity intolerance will decrease Outcome: Progressing   Problem: Nutrition: Goal: Adequate nutrition will be maintained Outcome: Progressing   Problem: Pain Managment: Goal: General experience of comfort will improve and/or be controlled Outcome: Progressing   Problem: Safety: Goal: Ability to remain free from injury will improve Outcome: Progressing   Problem: Skin Integrity: Goal: Risk for impaired skin integrity will decrease Outcome: Progressing

## 2023-12-31 NOTE — Progress Notes (Signed)
 PULMONOLOGY         Date: 12/31/2023,   MRN# [id number] Alexandra Macias 1961/02/01     AdmissionWeight: (!) 165.1 kg                 CurrentWeight: (!) 165.1 kg  Referring provider: Dr Kandis   CHIEF COMPLAINT:   Acute Asthma exacerbation   HISTORY OF PRESENT ILLNESS   63 yo F with severe persistent asthma, HTN, DM, dyslpidemia who came in with respiratory distress and asthma exacerbation.  She had severe chest pain which has now improved from 10 to 2.  She required 2L/min [place] on arrival.  Her cardiac biomarkers were elevated. She also appeared to have hypercarbia peripherally.  She is being treated for Asthma exacerbation with steroids and nebulizer.  She reports recurrent pedal edema.    12/31/23- patient cleared for dc home, she's on room air speaking in full sentences.  Were tapering her steroids with PO regimen now. She is now not wheezing.  She reports feeling more comfortable in hospital and does not wish to leave quite yet.  Medically I think she would be appropriate for dc home with outpatient follow up. Ive ordered prednisone  with 50mg  dose to be tapered by 5mg  daily.  Her rhinovirus infection has resolved.    PAST MEDICAL HISTORY   Past Medical History:  Diagnosis Date   Asthma    Diabetes mellitus without complication (HCC)    DVT (deep venous thrombosis) (HCC) 2005   post op hysterectomy   Headache    History of Clostridium difficile infection    History of migraine    Hyperlipidemia    Hypertension      SURGICAL HISTORY   Past Surgical History:  Procedure Laterality Date   ABDOMINAL HYSTERECTOMY  04/07/2003   FOOT FUSION  04/06/2013   right-fx   FRACTURE SURGERY     HARDWARE REMOVAL Right 07/05/2014   Procedure: REMOVAL OF DEEP IMPLANTS OF RIGHT ANKLE;  Surgeon: [name], MD;  Location: [facility];  Service: Orthopedics;  Laterality: Right;   ORIF FOOT FRACTURE  04/06/2008   left   ORIF WRIST FRACTURE     both rt and lt      FAMILY HISTORY   Family History  Problem Relation Age of Onset   Hypertension Mother    Hypertension Father    Heart attack Father    Hypertension Sister    Breast cancer Neg Hx      SOCIAL HISTORY   Social History   Tobacco Use   Smoking status: Former    Current packs/day: 0.00    Types: Cigarettes    Quit date: 12/26/2013    Years since quitting: 10.0   Smokeless tobacco: Never  Vaping Use   Vaping status: Never Used  Substance Use Topics   Alcohol use: Not Currently    Comment: rare   Drug use: No     MEDICATIONS     Current Medication:  Current Facility-Administered Medications:    acetaminophen  (TYLENOL ) tablet 650 mg, 650 mg, Oral, Q6H PRN, 650 mg at 12/31/23 0330 **OR** acetaminophen  (TYLENOL ) suppository 650 mg, 650 mg, Rectal, Q6H PRN, [name], [name], MD   alum & mag hydroxide-simeth (MAALOX/MYLANTA) 200-200-20 MG/5ML suspension 30 mL, 30 mL, Oral, Q6H PRN, [name], [name], MD, 30 mL at 12/28/23 0049   arformoterol  (BROVANA ) nebulizer solution 15 mcg, 15 mcg, Nebulization, BID, Sreenath, [name], MD, 15 mcg at 12/31/23 0727   baclofen  (  LIORESAL ) tablet 10 mg, 10 mg, Oral, QHS, [name], [name], MD, 10 mg at 12/30/23 2039   benzonatate  (TESSALON ) capsule 200 mg, 200 mg, Oral, TID, [name], [name], MD, 200 mg at 12/31/23 9078   budesonide  (PULMICORT ) nebulizer solution 0.25 mg, 0.25 mg, Nebulization, BID, Sreenath, [name], MD, 0.25 mg at 12/31/23 9272   chlorpheniramine-HYDROcodone  (TUSSIONEX) 10-8 MG/5ML suspension 5 mL, 5 mL, Oral, QHS, Sreenath, [name], MD, 5 mL at 12/30/23 2039   enoxaparin  (LOVENOX ) injection 85 mg, 85 mg, Subcutaneous, Q24H, [name], [name], MD, 85 mg at 12/31/23 9078   feeding supplement (GLUCERNA SHAKE) (GLUCERNA SHAKE) liquid 237 mL, 237 mL, Oral, TID BM, Sreenath, [name], MD, 237 mL at 12/30/23 2339   furosemide  (LASIX ) injection 40 mg, 40 mg, Intravenous, Daily, Adalena Abdulla, MD, 40 mg at 12/31/23 9077    furosemide  (LASIX ) tablet 20 mg, 20 mg, Oral, Daily PRN, [name], [name], MD   gabapentin  (NEURONTIN ) capsule 300 mg, 300 mg, Oral, TID, [name], [name], MD, 300 mg at 12/31/23 9078   glimepiride  (AMARYL ) tablet 1 mg, 1 mg, Oral, Q breakfast, [name], [name], MD, 1 mg at 12/31/23 9077   guaiFENesin  (MUCINEX ) 12 hr tablet 600 mg, 600 mg, Oral, BID, [name], [name], MD, 600 mg at 12/31/23 9078   guaiFENesin -dextromethorphan  (ROBITUSSIN DM) 100-10 MG/5ML syrup 5 mL, 5 mL, Oral, Q4H PRN, [name], MD, 5 mL at 12/31/23 0218   hydrochlorothiazide  (HYDRODIURIL ) tablet 25 mg, 25 mg, Oral, Daily, [name], [name], MD, 25 mg at 12/31/23 9078   insulin  aspart (novoLOG ) injection 0-20 Units, 0-20 Units, Subcutaneous, TID WC, [name], MD, 7 Units at 12/30/23 1720   insulin  aspart (novoLOG ) injection 0-5 Units, 0-5 Units, Subcutaneous, QHS, [name], [name], MD, 2 Units at 12/26/23 2145   ipratropium-albuterol  (DUONEB) 0.5-2.5 (3) MG/3ML nebulizer solution 3 mL, 3 mL, Nebulization, QID, Amilya Haver, MD, 3 mL at 12/31/23 9272   ipratropium-albuterol  (DUONEB) 0.5-2.5 (3) MG/3ML nebulizer solution 3 mL, 3 mL, Nebulization, Q4H PRN, Irianna Gilday, MD   magnesium  hydroxide (MILK OF MAGNESIA) suspension 30 mL, 30 mL, Oral, Daily PRN, [name], [name], MD   methylPREDNISolone  sodium succinate (SOLU-MEDROL ) 40 mg/mL injection 40 mg, 40 mg, Intravenous, Q24H, Sharvil Hoey, MD, 40 mg at 12/31/23 9078   ondansetron  (ZOFRAN ) tablet 4 mg, 4 mg, Oral, Q6H PRN **OR** ondansetron  (ZOFRAN ) injection 4 mg, 4 mg, Intravenous, Q6H PRN, [name], [name], MD   oxyCODONE  (Oxy IR/ROXICODONE ) immediate release tablet 5-10 mg, 5-10 mg, Oral, Q4H PRN, [name], [name], MD, 10 mg at 12/31/23 0216   rosuvastatin  (CRESTOR ) tablet 40 mg, 40 mg, Oral, Daily, [name], [name], MD, 40 mg at 12/31/23 9078   traZODone  (DESYREL ) tablet 25 mg, 25 mg, Oral, QHS PRN, [name], [name], MD, 25 mg at 12/29/23 2251    ALLERGIES   Metformin and  Lisinopril      REVIEW OF SYSTEMS    Review of Systems:  Gen:  Denies  fever, sweats, chills weigh loss  HEENT: Denies blurred vision, double vision, ear pain, eye pain, hearing loss, nose bleeds, sore throat Cardiac:  No dizziness, chest pain or heaviness, chest tightness,edema Resp:   reports dyspnea chronically  Gi: Denies swallowing difficulty, stomach pain, nausea or vomiting, diarrhea, constipation, bowel incontinence Gu:  Denies bladder incontinence, burning urine Ext:   Denies Joint pain, stiffness or swelling Skin: Denies  skin rash, easy bruising or bleeding or hives Endoc:  Denies polyuria, polydipsia , polyphagia or weight  change Psych:   Denies depression, insomnia or hallucinations   Other:  All other systems negative   VS: BP 128/77 (BP Location: Left Arm)   Pulse 66   Temp 97.9 F (36.6 C)   Resp 20   Ht 5' 7 (1.702 m)   Wt (!) 165.1 kg   SpO2 95%   BMI 57.01 kg/m      PHYSICAL EXAM    GENERAL:NAD, no fevers, chills, no weakness no fatigue HEAD: Normocephalic, atraumatic.  EYES: Pupils equal, round, reactive to light. Extraocular muscles intact. No scleral icterus.  MOUTH: Moist mucosal membrane. Dentition intact. No abscess noted.  EAR, NOSE, THROAT: Clear without exudates. No external lesions.  NECK: Supple. No thyromegaly. No nodules. No JVD.  PULMONARY: decreased breath sounds with mild rhonchi worse at bases bilaterally.  CARDIOVASCULAR: S1 and S2. Regular rate and rhythm. No murmurs, rubs, or gallops. No edema. Pedal pulses 2+ bilaterally.  GASTROINTESTINAL: Soft, nontender, nondistended. No masses. Positive bowel sounds. No hepatosplenomegaly.  MUSCULOSKELETAL: No swelling, clubbing, or edema. Range of motion full in all extremities.  NEUROLOGIC: Cranial nerves II through XII are intact. No gross focal neurological deficits. Sensation intact. Reflexes intact.  SKIN: No ulceration, lesions, rashes, or cyanosis. Skin warm and dry. Turgor intact.   PSYCHIATRIC: Mood, affect within normal limits. The patient is awake, alert and oriented x 3. Insight, judgment intact.       IMAGING     Narrative & Impression  CLINICAL DATA:  Pleuritic chest pain.   EXAM: PORTABLE CHEST 1 VIEW   COMPARISON:  12/23/2023   FINDINGS: Lungs are hypoinflated without focal airspace consolidation or effusion. Cardiomediastinal silhouette and remainder of the exam is unchanged.   IMPRESSION: Hypoinflation without acute cardiopulmonary disease.     Electronically Signed   By: Toribio Agreste M.D.   On: 12/29/2023 11:35        ASSESSMENT/PLAN   Acute asthma exacerbation - severe      - RSV/COVID/FLU negative      - RVP =rhinovirus      - CRP ordered        - lasix  increased due to interstitial edem      - continue current steroids with pred 50 taper   Atelectasis     Chest physiotherapy TID with metaneb   OSA/OHS overlap    - ABG for BIPAP qualification              Thank you for allowing me to participate in the care of this patient.   Patient/Family are satisfied with care plan and all questions have been answered.    Provider disclosure: Patient with at least one acute or chronic illness or injury that poses a threat to life or bodily function and is being managed actively during this encounter.  All of the below services have been performed independently by signing provider:  review of prior documentation from internal and or external health records.  Review of previous and current lab results.  Interview and comprehensive assessment during patient visit today. Review of current and previous chest radiographs/CT scans. Discussion of management and test interpretation with health care team and patient/family.   This document was prepared using Dragon voice recognition software and may include unintentional dictation errors.     Aminah Zabawa, M.D.  Division of Pulmonary & Critical Care Medicine

## 2023-12-31 NOTE — Progress Notes (Signed)
 Mobility Specialist - Progress Note   Pre-mobility: SpO2- 96 During mobility:SpO2- 92 Post-mobility: SPO2- 94     12/31/23 1000  Oxygen Therapy  SpO2 96 %  O2 Device Room Air  Mobility  Activity Ambulated independently  Level of Assistance Independent after set-up  Assistive Device Front wheel walker  Distance Ambulated (ft) 650 ft  Activity Response Tolerated well  Mobility visit 1 Mobility  Mobility Specialist Start Time (ACUTE ONLY) 0915  Mobility Specialist Stop Time (ACUTE ONLY) 1008  Mobility Specialist Time Calculation (min) (ACUTE ONLY) 53 min   Pt was supine in bed upon entry. Nurse was in room providing medications. Physician entered room as well. Pt agreed to mobility. Pt was able to EOB independently. Pt was able to STS independently. Pt was able to ambulate well with a BRW. Pt displayed increased lateral movement. During activity pt O2 levels >88-94% O2 on RA. Pt did display intermitted whezzing throughout activity. Recovery breaks were used. After activity pt returned to the room in recliner with needs in reach.  Clem Rodes Mobility Specialist 12/31/23, 10:15 AM

## 2023-12-31 NOTE — Telephone Encounter (Signed)
 Patient Product/process development scientist completed.    The patient is insured through [facility]. Patient has Medicare and is not eligible for a copay card, but may be able to apply for patient assistance or Medicare RX Payment Plan (Patient Must reach out to their plan, if eligible for payment plan), if available.    Ran test claim for Qvar 80 mcg and the current 30 day co-pay is $294.81 due to a deductible.  Ran test claim for Arnuity Ellipta 100 mcg and the current 30 day co-pay is $209.78 due to a deductible.  This test claim was processed through [facility]- copay amounts may vary at other pharmacies due to pharmacy/plan contracts, or as the patient moves through the different stages of their insurance plan.     Reyes Sharps, CPHT Pharmacy Technician III Certified Patient Advocate [facility] Patient Advocate Team Direct Number: [phone]  Fax: [phone]

## 2024-01-04 NOTE — Progress Notes (Signed)
 Chief Complaint  Patient presents with  . Hospital Follow Up    Exacerbation of persistent asthma, unspecified asthma severity (Primary Dx); Acute respiratory failure with hypoxia [facility]) Discharge Disposition: 01-Home or Self Care      Patient is agreeable to [facility] scribe.   History of Present Illness Alexandra Macias is a 63 year old female with asthma who presents for a hospital follow-up after a recent asthma exacerbation secondary to rhinovirus.  She was hospitalized from September 18 to December 31, 2023, due to an asthma exacerbation triggered by rhinovirus. During her hospital stay, she received nebulizer treatments and steroids and was discharged on a steroid taper. She is currently trying to obtain a long-acting steroid inhaler and remains on Singulair  for asthma management.  She experiences persistent chest soreness from coughing and wheezing, ongoing since her hospitalization. She had another asthma attack after discharge and has been having panic attacks, especially when alone. Her chest is described as 'so sore', and she mentions skin irritation from the adhesive of EKG patches. She is currently on prednisone  but dislikes it as it increases her appetite.  Her current medications include Singulair  for asthma, gabapentin  for neuropathy pain, Amaryl  for type 2 diabetes, hydrochlorothiazide  for blood pressure, Crestor  for cholesterol, and Lasix  for leg edema. She uses albuterol  inhalers, noting that a red inhaler helps her more than the one previously prescribed. She has been using Mucinex  and Alka-Seltzer cold Plus to manage mucus and cold symptoms, but finds them less effective this time.  She has a history of type 2 diabetes with neuropathy and has experienced C. diff infections in the past. She is monitoring for symptoms of C. diff recurrence. She expresses concern about the affordability of medications and equipment, such as nebulizers, due to insurance coverage issues.  Her  asthma symptoms, including wheezing and coughing, are more pronounced at night, leading to frequent use of her inhaler. She is also experiencing anxiety, which she believes may be exacerbating her asthma symptoms. She often calls her mother during panic attacks for support.    ROS  Review of systems is unremarkable for any active cardiac, respiratory, GI, GU, hematologic, neurologic, dermatologic, HEENT, or psychiatric symptoms except as noted above.  No fevers, chills, or constitutional symptoms.   Current Outpatient Medications  Medication Sig Dispense Refill  . albuterol  90 mcg/actuation inhaler Inhale 2 inhalations into the lungs every 6 (six) hours as needed for Wheezing 18 g 3  . FUROsemide  (LASIX ) 20 MG tablet Take 1 tablet (20 mg total) by mouth once daily as needed for Edema for up to 30 days 30 tablet 0  . gabapentin  (NEURONTIN ) 300 MG capsule Take 300 mg by mouth at bedtime as needed  TAKE 1 CAPSULE BY MOUTH AT BEDTIME FOR 3 DAYS THEN INCREASE TO 1 CAP TWICE DAILY FOR 3 DAYS THEN INCREASE TO 1 CAP THREE TIMES DAILY THEREAFTER    . glimepiride  (AMARYL ) 1 MG tablet Take 1 mg by mouth every morning    . hydroCHLOROthiazide  (HYDRODIURIL ) 25 MG tablet Take 1 tablet (25 mg total) by mouth once daily 30 tablet 11  . montelukast  (SINGULAIR ) 10 mg tablet Take 1 tablet (10 mg total) by mouth at bedtime 90 tablet 3  . predniSONE  (DELTASONE ) 10 MG tablet Take by mouth Take 5 tablets (50 mg total) by mouth daily for 2 days, THEN 4 tablets (40 mg total) daily for 2 days, THEN 3 tablets (30 mg total) daily for 2 days, THEN 2 tablets (20 mg total) daily for  2 days, THEN 1 tablet (10 mg total) daily for 2 days.    . rosuvastatin  (CRESTOR ) 40 MG tablet Take 1 tablet (40 mg total) by mouth once daily 90 tablet 3  . albuterol  (ACCUNEB ) 0.63 mg/3 mL nebulizer solution Take 3 mLs (0.63 mg total) by nebulization every 6 (six) hours as needed for Wheezing 75 mL 12  . budesonide  (PULMICORT ) 0.5 mg/2 mL nebulizer  solution Take 2 mLs (0.5 mg total) by nebulization once daily for 7 days 14 mL 0  . sertraline (ZOLOFT) 25 MG tablet Take 1 tablet (25 mg total) by mouth once daily 30 tablet 11   No current facility-administered medications for this visit.    Allergies as of 01/04/2024 - Reviewed 01/04/2024  Allergen Reaction Noted  . Metformin Diarrhea 07/08/2023  . Lisinopril  Cough 02/03/2023    Patient Active Problem List  Diagnosis  . Former smoker  . History of migraine  . Hyperlipidemia  . Essential hypertension  . Chronic pain of left knee  . BMI 50.0-59.9, adult (CMS/HHS-HCC)  . Moderate persistent asthma (HHS-HCC)  . History of DVT (deep vein thrombosis)  . Type 2 diabetes mellitus without complication, without long-term current use of insulin  (CMS/HHS-HCC)  . Diarrhea in adult patient  . History of Clostridium difficile infection  . Colon cancer screening    Past Medical History:  Diagnosis Date  . C. difficile colitis 01/24/2023  . Former smoker   . Hyperlipidemia   . Hypertension   . Multinodular goiter   . Postoperative deep vein thrombosis (DVT) (CMS/HHS-HCC)    2005 - post hysterectomy     Past Surgical History:  Procedure Laterality Date  . FOOT SURGERY RIGHT Bilateral   . HYSTERECTOMY    . ORIF WRIST  Bilateral     Vitals:   01/04/24 1038  BP: (!) 152/88  Pulse: 70  SpO2: 96%  Weight: (!) 168.8 kg (372 lb 3.2 oz)  Height: 170.2 cm (5' 7)  PainSc: 0-No pain   Body mass index is 58.29 kg/m.  Exam BP (!) 152/88 (BP Location: Left upper arm, Patient Position: Sitting, BP Cuff Size: Large Adult)   Pulse 70   Ht 170.2 cm (5' 7)   Wt (!) 168.8 kg (372 lb 3.2 oz)   SpO2 96%   BMI 58.29 kg/m   General. Well appearing; NAD; VS reviewed     Eyes. Sclera and conjunctiva clear; Vision grossly intact; extraocular movements intact Oropharynx. No suspicious lesions Neck. Supple. No swelling, masses, thyroid normal size, no masses palpated.   Lungs.  Respirations unlabored; clear to auscultation bilaterally Cardiovascular. Heart regular rate and rhythm without murmurs, gallops, or rubs Extremities: Trace edema on the ankles. Skin. Normal color and turgor Neurologic. Alert and oriented x3; CN 2-12 grossly intact; no focal deficits  Assessment & Plan  Asthma exacerbation secondary to rhinovirus Recent exacerbation due to rhinovirus, causing hospitalization. Persistent chest soreness and wheezing. Insurance issues with long-acting steroid inhaler. - Prescribe nebulizer machine with albuterol  solution. - Send budesonide  nebulizer solution for one week. - Refer to pulmonologist for long-acting inhaler coverage. - Continue montelukast  (Singulair ).  Generalized anxiety disorder Experiencing panic attacks, especially during asthma exacerbations. Anxiety may contribute to breathing difficulties. - Prescribe Zoloft 25 mg daily. - Schedule follow-up in 4 weeks to assess response to Zoloft.  Type 2 diabetes mellitus with diabetic neuropathy Managed with gabapentin  and Amaryl .  Continue to follow low-carb diet while being on prednisone .  Essential hypertension Managed with hydrochlorothiazide .  Blood pressure  elevated today but patient is anxious with ongoing health issues.  Peripheral edema Managed with Lasix  as needed.  No acute swelling issues at this time.    I reviewed the hospital discharge summary, medication and allergies reconciliation, and any relevant labs/imaging. A phone call was made to the patient within 2 business days of discharge to review their condition, medications, and follow-up needs. Patient seen in clinic today for post-discharge follow-up as part of transition of care management.   F/U: Patient to follow-up in 1 month.  JASON HESTLE WHITAKER, PA  This note has been created using automated tools and reviewed for accuracy by JASON HESTLE WHITAKER.   Note: This dictation was prepared with Dragon dictation along  with smaller phrase technology. Any transcriptional errors that result from this process are unintentional.
